# Patient Record
Sex: Female | Born: 1980 | Hispanic: No | Marital: Single | Smoking: Never smoker
Health system: Southern US, Community
[De-identification: ages and names within clinical notes are randomized; demographics above are authoritative.]

## PROBLEM LIST (undated history)

## (undated) ENCOUNTER — Inpatient Hospital Stay (HOSPITAL_COMMUNITY): Payer: Self-pay

## (undated) DIAGNOSIS — R011 Cardiac murmur, unspecified: Secondary | ICD-10-CM

## (undated) DIAGNOSIS — D649 Anemia, unspecified: Secondary | ICD-10-CM

## (undated) DIAGNOSIS — IMO0002 Reserved for concepts with insufficient information to code with codable children: Secondary | ICD-10-CM

## (undated) DIAGNOSIS — E669 Obesity, unspecified: Secondary | ICD-10-CM

## (undated) DIAGNOSIS — R87619 Unspecified abnormal cytological findings in specimens from cervix uteri: Secondary | ICD-10-CM

## (undated) DIAGNOSIS — Z8619 Personal history of other infectious and parasitic diseases: Secondary | ICD-10-CM

## (undated) HISTORY — DX: Anemia, unspecified: D64.9

## (undated) HISTORY — DX: Obesity, unspecified: E66.9

## (undated) HISTORY — DX: Personal history of other infectious and parasitic diseases: Z86.19

## (undated) HISTORY — DX: Unspecified abnormal cytological findings in specimens from cervix uteri: R87.619

## (undated) HISTORY — PX: TONSILLECTOMY: SUR1361

## (undated) HISTORY — DX: Cardiac murmur, unspecified: R01.1

## (undated) HISTORY — PX: WISDOM TOOTH EXTRACTION: SHX21

## (undated) HISTORY — DX: Reserved for concepts with insufficient information to code with codable children: IMO0002

---

## 1998-10-21 ENCOUNTER — Emergency Department (HOSPITAL_COMMUNITY): Admission: EM | Admit: 1998-10-21 | Discharge: 1998-10-21 | Payer: Self-pay | Admitting: *Deleted

## 1999-10-08 ENCOUNTER — Encounter: Admission: RE | Admit: 1999-10-08 | Discharge: 1999-10-08 | Payer: Self-pay | Admitting: Internal Medicine

## 1999-10-08 ENCOUNTER — Encounter: Payer: Self-pay | Admitting: Internal Medicine

## 2000-07-27 ENCOUNTER — Other Ambulatory Visit: Admission: RE | Admit: 2000-07-27 | Discharge: 2000-07-27 | Payer: Self-pay | Admitting: Internal Medicine

## 2001-03-11 ENCOUNTER — Ambulatory Visit (HOSPITAL_COMMUNITY): Admission: RE | Admit: 2001-03-11 | Discharge: 2001-03-11 | Payer: Self-pay | Admitting: *Deleted

## 2001-04-07 ENCOUNTER — Ambulatory Visit (HOSPITAL_COMMUNITY): Admission: RE | Admit: 2001-04-07 | Discharge: 2001-04-07 | Payer: Self-pay | Admitting: Obstetrics & Gynecology

## 2001-06-01 ENCOUNTER — Encounter (HOSPITAL_COMMUNITY): Admission: AD | Admit: 2001-06-01 | Discharge: 2001-06-04 | Payer: Self-pay | Admitting: *Deleted

## 2001-06-07 ENCOUNTER — Inpatient Hospital Stay (HOSPITAL_COMMUNITY): Admission: AD | Admit: 2001-06-07 | Discharge: 2001-06-11 | Payer: Self-pay | Admitting: Obstetrics and Gynecology

## 2001-06-07 ENCOUNTER — Encounter (INDEPENDENT_AMBULATORY_CARE_PROVIDER_SITE_OTHER): Payer: Self-pay

## 2002-02-23 ENCOUNTER — Ambulatory Visit (HOSPITAL_COMMUNITY): Admission: RE | Admit: 2002-02-23 | Discharge: 2002-02-23 | Payer: Self-pay | Admitting: *Deleted

## 2002-03-30 ENCOUNTER — Ambulatory Visit (HOSPITAL_COMMUNITY): Admission: RE | Admit: 2002-03-30 | Discharge: 2002-03-30 | Payer: Self-pay | Admitting: *Deleted

## 2002-05-04 ENCOUNTER — Ambulatory Visit (HOSPITAL_COMMUNITY): Admission: RE | Admit: 2002-05-04 | Discharge: 2002-05-04 | Payer: Self-pay | Admitting: *Deleted

## 2002-06-08 ENCOUNTER — Ambulatory Visit (HOSPITAL_COMMUNITY): Admission: RE | Admit: 2002-06-08 | Discharge: 2002-06-08 | Payer: Self-pay | Admitting: *Deleted

## 2002-06-29 ENCOUNTER — Ambulatory Visit (HOSPITAL_COMMUNITY): Admission: RE | Admit: 2002-06-29 | Discharge: 2002-06-29 | Payer: Self-pay | Admitting: *Deleted

## 2002-07-16 ENCOUNTER — Encounter (INDEPENDENT_AMBULATORY_CARE_PROVIDER_SITE_OTHER): Payer: Self-pay | Admitting: Specialist

## 2002-07-18 ENCOUNTER — Inpatient Hospital Stay (HOSPITAL_COMMUNITY): Admission: AD | Admit: 2002-07-18 | Discharge: 2002-07-20 | Payer: Self-pay | Admitting: *Deleted

## 2005-02-19 ENCOUNTER — Ambulatory Visit (HOSPITAL_COMMUNITY): Admission: RE | Admit: 2005-02-19 | Discharge: 2005-02-19 | Payer: Self-pay | Admitting: *Deleted

## 2005-02-25 ENCOUNTER — Ambulatory Visit (HOSPITAL_COMMUNITY): Admission: RE | Admit: 2005-02-25 | Discharge: 2005-02-25 | Payer: Self-pay | Admitting: *Deleted

## 2005-03-17 ENCOUNTER — Inpatient Hospital Stay (HOSPITAL_COMMUNITY): Admission: AD | Admit: 2005-03-17 | Discharge: 2005-03-18 | Payer: Self-pay | Admitting: Family Medicine

## 2005-03-20 ENCOUNTER — Ambulatory Visit (HOSPITAL_COMMUNITY): Admission: RE | Admit: 2005-03-20 | Discharge: 2005-03-20 | Payer: Self-pay | Admitting: Family Medicine

## 2005-04-21 ENCOUNTER — Ambulatory Visit (HOSPITAL_COMMUNITY): Admission: RE | Admit: 2005-04-21 | Discharge: 2005-04-21 | Payer: Self-pay | Admitting: *Deleted

## 2005-08-22 ENCOUNTER — Inpatient Hospital Stay (HOSPITAL_COMMUNITY): Admission: RE | Admit: 2005-08-22 | Discharge: 2005-08-25 | Payer: Self-pay | Admitting: Family Medicine

## 2005-08-22 ENCOUNTER — Ambulatory Visit: Payer: Self-pay | Admitting: *Deleted

## 2006-10-16 ENCOUNTER — Emergency Department (HOSPITAL_COMMUNITY): Admission: EM | Admit: 2006-10-16 | Discharge: 2006-10-16 | Payer: Self-pay | Admitting: Emergency Medicine

## 2007-08-15 ENCOUNTER — Emergency Department (HOSPITAL_COMMUNITY): Admission: EM | Admit: 2007-08-15 | Discharge: 2007-08-15 | Payer: Self-pay | Admitting: Emergency Medicine

## 2007-08-18 ENCOUNTER — Encounter: Payer: Self-pay | Admitting: Physician Assistant

## 2007-08-18 ENCOUNTER — Ambulatory Visit: Payer: Self-pay | Admitting: Obstetrics & Gynecology

## 2007-08-18 ENCOUNTER — Ambulatory Visit (HOSPITAL_COMMUNITY): Admission: RE | Admit: 2007-08-18 | Discharge: 2007-08-18 | Payer: Self-pay | Admitting: Family Medicine

## 2007-08-25 ENCOUNTER — Ambulatory Visit: Payer: Self-pay | Admitting: Obstetrics & Gynecology

## 2007-09-01 ENCOUNTER — Ambulatory Visit: Payer: Self-pay | Admitting: Obstetrics & Gynecology

## 2007-09-02 ENCOUNTER — Ambulatory Visit: Payer: Self-pay | Admitting: Obstetrics & Gynecology

## 2007-09-02 ENCOUNTER — Ambulatory Visit (HOSPITAL_COMMUNITY): Admission: RE | Admit: 2007-09-02 | Discharge: 2007-09-02 | Payer: Self-pay | Admitting: Family Medicine

## 2007-09-08 ENCOUNTER — Ambulatory Visit: Payer: Self-pay | Admitting: Obstetrics & Gynecology

## 2007-09-10 ENCOUNTER — Inpatient Hospital Stay (HOSPITAL_COMMUNITY): Admission: AD | Admit: 2007-09-10 | Discharge: 2007-09-12 | Payer: Self-pay | Admitting: Obstetrics & Gynecology

## 2007-09-10 ENCOUNTER — Ambulatory Visit: Payer: Self-pay | Admitting: Obstetrics & Gynecology

## 2010-03-31 ENCOUNTER — Encounter: Payer: Self-pay | Admitting: *Deleted

## 2010-07-26 NOTE — Discharge Summary (Signed)
[facility]  Patient:    Paige Novak, Paige Novak Visit Number: [id number] MRN: [id number]          Service Type: OBS Location: 910A [id number] [id number] Attending Physician:  Amada Kingfisher. Dictated by:   Arlis Porta, M.D. Admit Date:  06/07/2001 Discharge Date: 06/11/2001                             Discharge Summary  DISCHARGE DIAGNOSES:          16. A 30 year old, gravida 1, now para 1, status                                  post spontaneous vaginal delivery of a female                                  at 41-4/7 weeks.                               2. Postpartum hemorrhage.                               3. Endomyometritis.  PROCEDURES:                   A BPP was performed on admission which showed a score of 6/8 and low-normal amniotic fluid volume.  The patient had transfusion of two units of packed red blood cells.  LABORATORY DATA:              The CBC at discharge showed a white blood cell count of 11.7, hemoglobin 7.4, hematocrit 22.3, and platelet count 156.  The RPR was nonreactive.  The urinalysis was positive for 50 ketones, specific gravity 1.010, negative nitrites, and negative leukocyte esterase.  HOSPITAL COURSE: #1 - LABOR AND DELIVERY:  The patient is a 30 year old, G1, P0, who presented at 41-3/7 weeks with complaints of fetal tachycardia at her primary care physicians office.  She was found to have a decreasing AFI on BPP.  She was admitted for induction of labor, especially given her post dates.  She was started on Cytotec and was also started on Pitocin for augmentation.  She had artificial rupture of membranes.  An IUPC was placed.  She progressed to normal spontaneous vaginal delivery of a viable female on June 08, 2001, at 2111 hours.  There was a nuchal cord x 1 which was easily reduced.  Apgar scores were 8 and 9 at one and five minutes, respectively.  The placenta was intact and delivered spontaneously with a three-vessel cord noted.   There was a third degree laceration which was repaired with 2-0 and 3-0 Vicryl.  The estimated blood loss was 500 cc.  Both mother and baby tolerated the procedure well.  The patient did, however, have some postpartum hemorrhage with complaints of dizziness and associated tachycardia.  She was given IV fluids. She was started on methergine.  She was also started on Pitocin.  Her hemoglobin hit a low of 7.1 with a low hematocrit of 22.1.  She was transfused two units of packed red blood cells.  Her CBC stabilized to the levels stated above at discharge.  The patient was  asymptomatic at discharge with stabilization of her vital signs.  She will be discharged to home on iron supplementation.  #2 - ENDOMYOMETRITIS:  The patient developed a fever within 24 hours postpartum to a maximum of 102.5 degrees.  She was started on Unasyn IV.  She remained afebrile for 48 hours following that temperature.  She will be discharged to home on clindamycin p.o. x 7 days.  DISCHARGE MEDICATIONS:        1. Clindamycin 300 mg p.o. q.i.d. x 7 days.                               2. Tylenol p.r.n. for pain.                               3. Ferrous sulfate 325 mg p.o. b.i.d.  WOUND CARE:                   Per instruction booklet.  DIET:                         Regular.  ACTIVITY:                     Sexual activity per booklet.  FOLLOW-UP:                    She is to see [facility] in six weeks.  SPECIAL INSTRUCTIONS:         She is to return to the hospital for further bleeding, lightheadedness, fevers, and increased pain. Dictated by:   Arlis Porta, M.D. Attending Physician:  Amada Kingfisher. DD:  06/11/01 TD:  06/12/01 Job: [id number]

## 2010-07-26 NOTE — Discharge Summary (Signed)
Paige Novak, SCHRYVER                      ACCOUNT NO.:  [id number]   MEDICAL RECORD NO.:  [id number]                   PATIENT TYPE:  INP   LOCATION:  9139                                 FACILITY:  [facility]   PHYSICIAN:  Douglass Rivers, M.D.                DATE OF BIRTH:  [birth year]   DATE OF ADMISSION:  07/18/2002  DATE OF DISCHARGE:  07/20/2002                                 DISCHARGE SUMMARY   DISCHARGE DIAGNOSES:  1. Gravida 2, para 2, status post precipitous delivery of 39-4/7 weeks     viable female.  2. Status post second-degree laceration, repaired.  3. 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  4. Group B Streptococcus positive, untreated due to precipitous delivery.   DISCHARGE MEDICATIONS:  1. Motrin 600 mg one p.o. q.6h. p.r.n. pain.  2. Depo-Provera one shot every three months for contraception (first given     at [facility]).  3. Continue prenatal vitamins while breast feeding.   FOLLOW UP:  Return to [facility] in six weeks for a routine postpartum  followup.   DISCHARGE INSTRUCTIONS:  Otherwise, postpartum care as per routine.   BRIEF HOSPITAL COURSE:  The patient is a 30 year old G2, P1-0-0-1 who  presented at 39-4 weeks on 07/18/2002 after having contractions overnight.  Upon arrival to the maternity admissions, she was found to be 7 to 8 cm  dilated and then subsequently delivered precipitously a term female infant.  There were otherwise no complications except for a small, second-degree  laceration that was repaired.   Patient was GBS positive with a PENICILLIN allergy.  This GBS was noticed to  be sensitive only to Augmentin (clindamycin, erythromycin, and penicillin  resistant); the recommended was vancomycin, which was unable to be done due  to the precipitous delivery.   OTHER PAST MEDICAL HISTORY:  1. Significant for a history of anemia and blood transfusion after her first     delivery.  2. Has a history of external HPV.   PRENATAL HISTORY:  For  this pregnancy is significant for the infant had  abdominal findings on ultrasound, which were concerning for a bowel  obstruction.   MEDICATIONS:  Prenatal vitamins and iron.   ALLERGIES:  PENICILLIN, which gives hives and rash.   LABORATORY DATA:  Prenatal labs, blood type A positive, antibody screen  negative.  Hemoglobin (at discharge) is 9.3, platelets 250.  Rubella immune.  Hepatitis B positive.  Glucose was nonreactive.  _______ was declined.  GC  and Chlamydia are negative.  GBS was positive.   PHYSICAL EXAMINATION:  PELVIC:  Within normal limits.  No HPV lesions were  noted on the external genitalia.   PLAN:  At discharge, patient was doing well.  Understands the infant was  doing well in the newborn nursery.  Mother was breast and bottle feeding  without any issues.  ________ was declined for the female infant.  Patient  requested Depo-Provera shot to  be given prior to discharge.   Apgars at delivery were 8 and 9.  Infant weighed 8 pounds 4 ounces.   DISPOSITION:  Patient was discharged to home with the previously described  prescriptions and followup.                                               Douglass Rivers, M.D.    CH/MEDQ  D:  07/20/2002  T:  07/20/2002  Job:  [id number]

## 2010-12-05 LAB — POCT URINALYSIS DIP (DEVICE)
Glucose, UA: NEGATIVE
Glucose, UA: NEGATIVE
Glucose, UA: NEGATIVE
Glucose, UA: NEGATIVE
Hgb urine dipstick: NEGATIVE
Ketones, ur: NEGATIVE
Operator id: 120861
Operator id: 148111
Operator id: 120861
Specific Gravity, Urine: 1.01
Specific Gravity, Urine: 1.015
Specific Gravity, Urine: 1.02
Specific Gravity, Urine: 1.02
Urobilinogen, UA: 0.2
Urobilinogen, UA: 1
Urobilinogen, UA: 1
Urobilinogen, UA: 1
pH: 6.5

## 2010-12-05 LAB — CBC
HCT: 28.7 — ABNORMAL LOW
Hemoglobin: 9.2 — ABNORMAL LOW
MCHC: 32.1
MCV: 80.1
MCV: 80.9
RBC: 3.9
RBC: 3.51 — ABNORMAL LOW
RBC: 3.81 — ABNORMAL LOW
RDW: 13.8
RDW: 14
WBC: 12.7 — ABNORMAL HIGH
WBC: 9.7

## 2010-12-05 LAB — POCT I-STAT, CHEM 8
Calcium, Ion: 1.09 — ABNORMAL LOW
Glucose, Bld: 68 — ABNORMAL LOW
HCT: 32 — ABNORMAL LOW
Hemoglobin: 10.9 — ABNORMAL LOW
Potassium: 3.4 — ABNORMAL LOW

## 2010-12-05 LAB — URINALYSIS, ROUTINE W REFLEX MICROSCOPIC
Ketones, ur: >80 — AB
Nitrite: NEGATIVE
Protein, ur: 30 — AB
Urobilinogen, UA: 1

## 2010-12-05 LAB — DIFFERENTIAL
Basophils Relative: 0
Eosinophils Absolute: 0
Monocytes Absolute: 0.4
Monocytes Relative: 6
Neutrophils Relative %: 78 — ABNORMAL HIGH

## 2010-12-05 LAB — RPR: RPR Ser Ql: NONREACTIVE

## 2010-12-23 LAB — WET PREP, GENITAL
Trich, Wet Prep: NONE SEEN
Yeast Wet Prep HPF POC: NONE SEEN

## 2010-12-23 LAB — GC/CHLAMYDIA PROBE AMP, GENITAL: GC Probe Amp, Genital: NEGATIVE

## 2010-12-23 LAB — URINALYSIS, ROUTINE W REFLEX MICROSCOPIC
Bilirubin Urine: NEGATIVE
Glucose, UA: NEGATIVE
Ketones, ur: 15 — AB
pH: 6

## 2011-03-11 NOTE — L&D Delivery Note (Signed)
Delivery Note  Patient was in the OR in preparation for cesarean section.  After receiving spinal anesthesia, she was noted to be fully dilated with fetal feet out of the vagina and most of fetal body in vagina. The decision was discussed with the patient, and she consented to trial of vaginal breech extraction.  Dr. Christin Bach was called to assist with the breech delivery. Given recent spinal anesthesia, the patient was not able to push adequately, and required some help via fundal pressure from Dr. Emelda Fear.  The difficult part of the delivery was delivery of the arms which were noted to be extended above fetal head. With some difficulty, the right arm was flexed and delivered, and then it was less difficult to deliver the left arm.  The head delivered without much difficulty.    At 9:12 AM a viable female was delivered.  APGAR: 2, 9; arterial cord pH 7.21.  He was noted to have difficulty in moving right arm, left arm appeared to have normal mobility. There were also superficial scratches on baby's torso and neck sustained during delivery.  After the first minute, he started crying and moving vigorously, no other anomalies noted. Please refer to neonatology staff notes for further details.    Placenta status: Intact, Spontaneous.  Cord: 3 vessels Anesthesia: Spinal  Episiotomy: None Lacerations: 1st degree;Perineal, not repaired Est. Blood Loss (mL): 400 ml  Mom to L&D.  Baby to nursery-stable.  Jaynie Collins, MD, FACOG Attending Obstetrician & Gynecologist Faculty Practice, [facility] [place]

## 2011-10-04 ENCOUNTER — Encounter (HOSPITAL_COMMUNITY): Payer: Self-pay | Admitting: *Deleted

## 2011-10-04 ENCOUNTER — Inpatient Hospital Stay (HOSPITAL_COMMUNITY)
Admission: AD | Admit: 2011-10-04 | Discharge: 2011-10-04 | Disposition: A | Discharge status: Home / Self Care | Payer: Medicaid Other | Source: Ambulatory Visit | Attending: Obstetrics and Gynecology | Admitting: Obstetrics and Gynecology

## 2011-10-04 DIAGNOSIS — R42 Dizziness and giddiness: Secondary | ICD-10-CM | POA: Insufficient documentation

## 2011-10-04 DIAGNOSIS — O99891 Other specified diseases and conditions complicating pregnancy: Secondary | ICD-10-CM | POA: Insufficient documentation

## 2011-10-04 LAB — URINALYSIS, ROUTINE W REFLEX MICROSCOPIC
Bilirubin Urine: NEGATIVE
Hgb urine dipstick: NEGATIVE
Specific Gravity, Urine: 1.025 (ref 1.005–1.030)
pH: 6 (ref 5.0–8.0)

## 2011-10-04 LAB — BASIC METABOLIC PANEL
BUN: 10 mg/dL (ref 6–23)
CO2: 24 mEq/L (ref 19–32)
Chloride: 100 mEq/L (ref 96–112)
Creatinine, Ser: 0.62 mg/dL (ref 0.50–1.10)

## 2011-10-04 LAB — CBC
HCT: 29.9 % — ABNORMAL LOW (ref 36.0–46.0)
Hemoglobin: 9.2 g/dL — ABNORMAL LOW (ref 12.0–15.0)
MCV: 79.5 fL (ref 78.0–100.0)
RBC: 3.76 MIL/uL — ABNORMAL LOW (ref 3.87–5.11)
WBC: 7.9 10*3/uL (ref 4.0–10.5)

## 2011-10-04 MED ORDER — FERROUS SULFATE 325 (65 FE) MG PO TABS: 325.0000 mg | ORAL_TABLET | Freq: Every day | ORAL | Status: DC

## 2011-10-04 NOTE — MAU Provider Note (Signed)
History     CSN: [id number]  Arrival date and time: 10/04/11 1435   First Provider Initiated Contact with Patient 10/04/11 1554      Chief Complaint  Patient presents with  . Dizziness   HPI Patient is a 31 yo G5P4004 at 65.5 EGA who presents with complaint of light headedness for the past couple of days.  States she gets light headed when she gets out of bed in the morning and when she gets up after lying down for a while.  It gets better when she immediately lies down.  She does not get light headed when she has been standing up for a while.  She denies any episodes of syncope.  States she has been able to eat and drink normally. She only eats 2 meals a day. She denies nausea and vomiting, chest pain, shortness of breath, changes in vision, headaches.  She has not had any blood pressure issues during this pregnancy.  OB History    Grav Para Term Preterm Abortions TAB SAB Ect Mult Living   5 4 4  0 0 0 0 0 0 4      Past Medical History  Diagnosis Date  . No pertinent past medical history     Past Surgical History  Procedure Date  . Wisdom tooth extraction     History reviewed. No pertinent family history.  History  Substance Use Topics  . Smoking status: Never Smoker   . Smokeless tobacco: Not on file  . Alcohol Use: No    Allergies:  Allergies  Allergen Reactions  . Penicillins Hives    Prescriptions prior to admission  Medication Sig Dispense Refill  . Prenatal Vit-Fe Fumarate-FA (PRENATAL MULTIVITAMIN) TABS Take 1 tablet by mouth daily.        ROS negative except per HPI Physical Exam   Blood pressure 116/66, pulse 89, temperature 98.5 F (36.9 C), temperature source Oral, resp. rate 18, height 5\' 4"  (1.626 m), weight 102.422 kg (225 lb 12.8 oz), SpO2 99.00%.  Filed Vitals:   10/04/11 1445 10/04/11 1523 lying down 10/04/11 1525 sitting 10/04/11 1527 standing  BP: 120/58 110/56 113/64 116/66  Pulse: 84 77 97 89  Temp: 98.5 F (36.9 C)     TempSrc:  Oral     Resp:  18 20 18   Height: 5\' 4"  (1.626 m)     Weight: 102.422 kg (225 lb 12.8 oz)     SpO2: 99%       Physical Exam  Constitutional: She is oriented to person, place, and time. She appears well-developed and well-nourished.  HENT:  Head: Normocephalic and atraumatic.  Mouth/Throat: Oropharynx is clear and moist.       Normal bilateral tympanic membranes  Cardiovascular: Normal rate, regular rhythm and normal heart sounds.   Respiratory: Effort normal and breath sounds normal.  GI: Soft. There is no tenderness.  Neurological: She is alert and oriented to person, place, and time.  Psychiatric: She has a normal mood and affect.   CBG 70 MAU Course  Procedures  Assessment and Plan  Patient is a G5P4004 at 30.5 EGA who presents with complaint of light headedness.  Light headedness: orthostatics borderline for orthostatic hypotension.  CBG within normal limits. CBC normal.  Patient advised to eat every 3 hours and to stay hydrated. Patient discharged home from the MAU.  Patient also advised to schedule a prenatal appointment with the health department as soon as possible.  Marikay Alar 10/04/2011, 4:03 PM   I  have seen/examined this patient and agree with the above. Romuald Mccaslin E.

## 2011-10-04 NOTE — MAU Note (Signed)
Patient c/o feeling light headed which started yesterday patient is 30 weeks, patient goes to [facility] for prenatal care.

## 2011-10-05 NOTE — MAU Provider Note (Signed)
Agree with above note.  Paige Novak 10/05/2011 7:43 AM

## 2011-11-12 ENCOUNTER — Other Ambulatory Visit (HOSPITAL_COMMUNITY): Payer: Self-pay | Admitting: Family

## 2011-11-12 DIAGNOSIS — Z0489 Encounter for examination and observation for other specified reasons: Secondary | ICD-10-CM

## 2011-11-13 ENCOUNTER — Ambulatory Visit (HOSPITAL_COMMUNITY)
Admission: RE | Admit: 2011-11-13 | Discharge: 2011-11-13 | Disposition: A | Discharge status: Home / Self Care | Payer: Medicaid Other | Source: Ambulatory Visit | Attending: Family | Admitting: Family

## 2011-11-13 ENCOUNTER — Other Ambulatory Visit (HOSPITAL_COMMUNITY): Payer: Self-pay | Admitting: Family

## 2011-11-13 DIAGNOSIS — Z3689 Encounter for other specified antenatal screening: Secondary | ICD-10-CM | POA: Insufficient documentation

## 2011-11-13 DIAGNOSIS — Z0489 Encounter for examination and observation for other specified reasons: Secondary | ICD-10-CM

## 2011-11-13 DIAGNOSIS — O0930 Supervision of pregnancy with insufficient antenatal care, unspecified trimester: Secondary | ICD-10-CM | POA: Insufficient documentation

## 2011-11-20 LAB — OB RESULTS CONSOLE GBS: GBS: POSITIVE

## 2011-11-27 ENCOUNTER — Encounter (HOSPITAL_COMMUNITY): Payer: Self-pay | Admitting: *Deleted

## 2011-11-27 ENCOUNTER — Telehealth (HOSPITAL_COMMUNITY): Payer: Self-pay | Admitting: *Deleted

## 2011-11-27 ENCOUNTER — Inpatient Hospital Stay (HOSPITAL_COMMUNITY)
Admission: RE | Admit: 2011-11-27 | Payer: Medicaid Other | Source: Ambulatory Visit | Admitting: Obstetrics and Gynecology

## 2011-11-27 NOTE — Telephone Encounter (Signed)
Preadmission screen  

## 2011-11-28 ENCOUNTER — Encounter (HOSPITAL_COMMUNITY): Payer: Self-pay | Admitting: *Deleted

## 2011-11-28 ENCOUNTER — Inpatient Hospital Stay (HOSPITAL_COMMUNITY)
Admission: AD | Admit: 2011-11-28 | Discharge: 2011-12-01 | DRG: 775 | Disposition: A | Discharge status: Home / Self Care | Payer: Medicaid Other | Source: Ambulatory Visit | Attending: Obstetrics and Gynecology | Admitting: Obstetrics and Gynecology

## 2011-11-28 ENCOUNTER — Inpatient Hospital Stay (HOSPITAL_COMMUNITY): Payer: Medicaid Other

## 2011-11-28 DIAGNOSIS — O4290 Premature rupture of membranes, unspecified as to length of time between rupture and onset of labor, unspecified weeks of gestation: Principal | ICD-10-CM | POA: Diagnosis present

## 2011-11-28 DIAGNOSIS — O321XX0 Maternal care for breech presentation, not applicable or unspecified: Secondary | ICD-10-CM | POA: Diagnosis present

## 2011-11-28 LAB — URINALYSIS, ROUTINE W REFLEX MICROSCOPIC
Glucose, UA: NEGATIVE mg/dL
Hgb urine dipstick: NEGATIVE
Specific Gravity, Urine: 1.02 (ref 1.005–1.030)
Urobilinogen, UA: 1 mg/dL (ref 0.0–1.0)

## 2011-11-28 LAB — RAPID URINE DRUG SCREEN, HOSP PERFORMED
Cocaine: NOT DETECTED
Opiates: NOT DETECTED
Tetrahydrocannabinol: NOT DETECTED
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
## 2011-11-28 NOTE — MAU Note (Signed)
Frequent urination around 9:30 with leaking constantly.  Does not feel her water has broken. Has occasional irregular contractions.
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
## 2011-11-29 ENCOUNTER — Encounter (HOSPITAL_COMMUNITY): Payer: Self-pay | Admitting: *Deleted

## 2011-11-29 ENCOUNTER — Inpatient Hospital Stay (HOSPITAL_COMMUNITY): Payer: Medicaid Other | Admitting: Anesthesiology

## 2011-11-29 ENCOUNTER — Encounter (HOSPITAL_COMMUNITY): Payer: Self-pay | Admitting: Anesthesiology

## 2011-11-29 ENCOUNTER — Encounter (HOSPITAL_COMMUNITY): Payer: Self-pay

## 2011-11-29 ENCOUNTER — Encounter (HOSPITAL_COMMUNITY)
Admission: AD | Disposition: A | Discharge status: Home / Self Care | Payer: Self-pay | Source: Ambulatory Visit | Attending: Obstetrics and Gynecology

## 2011-11-29 ENCOUNTER — Encounter (HOSPITAL_COMMUNITY): Payer: Self-pay | Admitting: Registered Nurse

## 2011-11-29 DIAGNOSIS — O4290 Premature rupture of membranes, unspecified as to length of time between rupture and onset of labor, unspecified weeks of gestation: Secondary | ICD-10-CM

## 2011-11-29 DIAGNOSIS — O321XX0 Maternal care for breech presentation, not applicable or unspecified: Secondary | ICD-10-CM

## 2011-11-29 LAB — CBC
HCT: 29.6 % — ABNORMAL LOW (ref 36.0–46.0)
Hemoglobin: 9.2 g/dL — ABNORMAL LOW (ref 12.0–15.0)
MCH: 24.8 pg — ABNORMAL LOW (ref 26.0–34.0)
MCHC: 31.1 g/dL (ref 30.0–36.0)
MCV: 79.8 fL (ref 78.0–100.0)
RBC: 3.71 MIL/uL — ABNORMAL LOW (ref 3.87–5.11)

## 2011-11-29 LAB — COMPREHENSIVE METABOLIC PANEL
AST: 26 U/L (ref 0–37)
CO2: 22 mEq/L (ref 19–32)
Calcium: 9.3 mg/dL (ref 8.4–10.5)
Creatinine, Ser: 0.62 mg/dL (ref 0.50–1.10)
GFR calc non Af Amer: >90 mL/min (ref >90)
Total Protein: 6.9 g/dL (ref 6.0–8.3)

## 2011-11-29 LAB — RAPID HIV SCREEN (WH-MAU): Rapid HIV Screen: NONREACTIVE

## 2011-11-29 SURGERY — Surgical Case
Anesthesia: Spinal | Wound class: Clean Contaminated

## 2011-11-29 MED ORDER — VANCOMYCIN HCL IN DEXTROSE 1-5 GM/200ML-% IV SOLN
1000.0000 mg | Freq: Two times a day (BID) | INTRAVENOUS | Status: DC
  Administered 2011-11-29: 1000 mg via INTRAVENOUS
  Filled 2011-11-29 (×3): qty 200

## 2011-11-29 MED ORDER — OXYCODONE-ACETAMINOPHEN 5-325 MG PO TABS: 1.0000 | ORAL_TABLET | ORAL | Status: DC | PRN

## 2011-11-29 MED ORDER — KETOROLAC TROMETHAMINE 30 MG/ML IJ SOLN: 30.0000 mg | Freq: Four times a day (QID) | INTRAMUSCULAR | Status: AC | PRN

## 2011-11-29 MED ORDER — OXYTOCIN 40 UNITS IN LACTATED RINGERS INFUSION - SIMPLE MED
1.0000 m[IU]/min | INTRAVENOUS | Status: DC
  Administered 2011-11-29: 2 m[IU]/min via INTRAVENOUS
  Filled 2011-11-29: qty 1000

## 2011-11-29 MED ORDER — OXYTOCIN 40 UNITS IN LACTATED RINGERS INFUSION - SIMPLE MED: 62.5000 mL/h | Freq: Once | INTRAVENOUS | Status: DC

## 2011-11-29 MED ORDER — PHENYLEPHRINE HCL 10 MG/ML IJ SOLN
INTRAMUSCULAR | Status: DC | PRN
  Administered 2011-11-29: 40 ug via INTRAVENOUS

## 2011-11-29 MED ORDER — ONDANSETRON HCL 4 MG PO TABS: 4.0000 mg | ORAL_TABLET | ORAL | Status: DC | PRN

## 2011-11-29 MED ORDER — SODIUM CHLORIDE 0.9 % IV SOLN: 1.0000 ug/kg/h | INTRAVENOUS | Status: DC | PRN

## 2011-11-29 MED ORDER — SIMETHICONE 80 MG PO CHEW: 80.0000 mg | CHEWABLE_TABLET | ORAL | Status: DC | PRN

## 2011-11-29 MED ORDER — ONDANSETRON HCL 4 MG/2ML IJ SOLN: 4.0000 mg | INTRAMUSCULAR | Status: DC | PRN

## 2011-11-29 MED ORDER — SENNOSIDES-DOCUSATE SODIUM 8.6-50 MG PO TABS: 2.0000 | ORAL_TABLET | Freq: Every day | ORAL | Status: DC

## 2011-11-29 MED ORDER — INFLUENZA VIRUS VACC SPLIT PF IM SUSP: 0.5000 mL | INTRAMUSCULAR | Status: DC

## 2011-11-29 MED ORDER — WITCH HAZEL-GLYCERIN EX PADS: 1.0000 "application " | MEDICATED_PAD | CUTANEOUS | Status: DC | PRN

## 2011-11-29 MED ORDER — VANCOMYCIN HCL IN DEXTROSE 1-5 GM/200ML-% IV SOLN: 1000.0000 mg | Freq: Two times a day (BID) | INTRAVENOUS | Status: DC

## 2011-11-29 MED ORDER — CLINDAMYCIN PHOSPHATE 900 MG/50ML IV SOLN
900.0000 mg | Freq: Three times a day (TID) | INTRAVENOUS | Status: DC
  Administered 2011-11-29: 900 mg via INTRAVENOUS
  Filled 2011-11-29 (×3): qty 50

## 2011-11-29 MED ORDER — METOCLOPRAMIDE HCL 5 MG/ML IJ SOLN: 10.0000 mg | Freq: Three times a day (TID) | INTRAMUSCULAR | Status: DC | PRN

## 2011-11-29 MED ORDER — IBUPROFEN 600 MG PO TABS
600.0000 mg | ORAL_TABLET | Freq: Four times a day (QID) | ORAL | Status: DC
  Administered 2011-11-30 – 2011-12-01 (×6): 600 mg via ORAL
  Filled 2011-11-29 (×7): qty 1

## 2011-11-29 MED ORDER — NALBUPHINE HCL 10 MG/ML IJ SOLN
5.0000 mg | INTRAMUSCULAR | Status: DC | PRN
Start: 2011-11-29 — End: 2011-12-01
  Filled 2011-11-29: qty 1

## 2011-11-29 MED ORDER — OXYTOCIN 40 UNITS IN LACTATED RINGERS INFUSION - SIMPLE MED
INTRAVENOUS | Status: AC
  Filled 2011-11-29: qty 1000

## 2011-11-29 MED ORDER — MORPHINE SULFATE 0.5 MG/ML IJ SOLN
INTRAMUSCULAR | Status: AC
  Filled 2011-11-29: qty 10

## 2011-11-29 MED ORDER — PRENATAL MULTIVITAMIN CH: 1.0000 | ORAL_TABLET | Freq: Every day | ORAL | Status: DC

## 2011-11-29 MED ORDER — FENTANYL CITRATE 0.05 MG/ML IJ SOLN
INTRAMUSCULAR | Status: AC
  Filled 2011-11-29: qty 5

## 2011-11-29 MED ORDER — BENZOCAINE-MENTHOL 20-0.5 % EX AERO: 1.0000 "application " | INHALATION_SPRAY | CUTANEOUS | Status: DC | PRN

## 2011-11-29 MED ORDER — MEPERIDINE HCL 25 MG/ML IJ SOLN: 6.2500 mg | INTRAMUSCULAR | Status: DC | PRN

## 2011-11-29 MED ORDER — CITRIC ACID-SODIUM CITRATE 334-500 MG/5ML PO SOLN
30.0000 mL | ORAL | Status: DC | PRN
  Administered 2011-11-29: 30 mL via ORAL
  Filled 2011-11-29: qty 15

## 2011-11-29 MED ORDER — SENNOSIDES-DOCUSATE SODIUM 8.6-50 MG PO TABS
2.0000 | ORAL_TABLET | Freq: Every day | ORAL | Status: DC
  Administered 2011-11-29 – 2011-11-30 (×2): 2 via ORAL

## 2011-11-29 MED ORDER — LACTATED RINGERS IV SOLN
500.0000 mL | INTRAVENOUS | Status: DC | PRN
  Administered 2011-11-29: 1000 mL via INTRAVENOUS

## 2011-11-29 MED ORDER — DIBUCAINE 1 % RE OINT: 1.0000 "application " | TOPICAL_OINTMENT | RECTAL | Status: DC | PRN

## 2011-11-29 MED ORDER — BUPIVACAINE IN DEXTROSE 0.75-8.25 % IT SOLN
INTRATHECAL | Status: DC | PRN
  Administered 2011-11-29: 1.4 mL via INTRATHECAL

## 2011-11-29 MED ORDER — OXYTOCIN 10 UNIT/ML IJ SOLN
INTRAMUSCULAR | Status: AC
  Filled 2011-11-29: qty 1

## 2011-11-29 MED ORDER — ZOLPIDEM TARTRATE 5 MG PO TABS: 5.0000 mg | ORAL_TABLET | Freq: Every evening | ORAL | Status: DC | PRN

## 2011-11-29 MED ORDER — TERBUTALINE SULFATE 1 MG/ML IJ SOLN
0.2500 mg | Freq: Once | INTRAMUSCULAR | Status: AC | PRN
  Administered 2011-11-29: 0.25 mg via SUBCUTANEOUS
  Filled 2011-11-29: qty 1

## 2011-11-29 MED ORDER — NALBUPHINE HCL 10 MG/ML IJ SOLN
5.0000 mg | INTRAMUSCULAR | Status: DC | PRN
  Filled 2011-11-29: qty 1

## 2011-11-29 MED ORDER — HYDROMORPHONE HCL PF 1 MG/ML IJ SOLN: 0.2500 mg | INTRAMUSCULAR | Status: DC | PRN

## 2011-11-29 MED ORDER — SCOPOLAMINE 1 MG/3DAYS TD PT72
1.0000 | MEDICATED_PATCH | Freq: Once | TRANSDERMAL | Status: DC
  Filled 2011-11-29: qty 1

## 2011-11-29 MED ORDER — FENTANYL CITRATE 0.05 MG/ML IJ SOLN
INTRAMUSCULAR | Status: DC | PRN
  Administered 2011-11-29: 12.5 ug via INTRATHECAL

## 2011-11-29 MED ORDER — KETOROLAC TROMETHAMINE 30 MG/ML IJ SOLN: 15.0000 mg | Freq: Once | INTRAMUSCULAR | Status: DC | PRN

## 2011-11-29 MED ORDER — PROMETHAZINE HCL 25 MG/ML IJ SOLN: 6.2500 mg | INTRAMUSCULAR | Status: DC | PRN

## 2011-11-29 MED ORDER — LACTATED RINGERS IV SOLN
INTRAVENOUS | Status: DC | PRN
  Administered 2011-11-29 (×3): via INTRAVENOUS

## 2011-11-29 MED ORDER — ONDANSETRON HCL 4 MG/2ML IJ SOLN: 4.0000 mg | Freq: Three times a day (TID) | INTRAMUSCULAR | Status: DC | PRN

## 2011-11-29 MED ORDER — PHENYLEPHRINE 40 MCG/ML (10ML) SYRINGE FOR IV PUSH (FOR BLOOD PRESSURE SUPPORT)
PREFILLED_SYRINGE | INTRAVENOUS | Status: AC
  Filled 2011-11-29: qty 5

## 2011-11-29 MED ORDER — LIDOCAINE HCL (PF) 1 % IJ SOLN: 30.0000 mL | INTRAMUSCULAR | Status: DC | PRN

## 2011-11-29 MED ORDER — VANCOMYCIN HCL IN DEXTROSE 1-5 GM/200ML-% IV SOLN
1000.0000 mg | Freq: Two times a day (BID) | INTRAVENOUS | Status: DC
  Filled 2011-11-29 (×2): qty 200

## 2011-11-29 MED ORDER — LACTATED RINGERS IV SOLN
INTRAVENOUS | Status: DC
  Administered 2011-11-29 (×2): via INTRAVENOUS

## 2011-11-29 MED ORDER — DIPHENHYDRAMINE HCL 50 MG/ML IJ SOLN: 25.0000 mg | INTRAMUSCULAR | Status: DC | PRN

## 2011-11-29 MED ORDER — OXYTOCIN 10 UNIT/ML IJ SOLN
40.0000 [IU] | INTRAVENOUS | Status: DC | PRN
  Administered 2011-11-29: 40 [IU] via INTRAVENOUS

## 2011-11-29 MED ORDER — LANOLIN HYDROUS EX OINT: TOPICAL_OINTMENT | CUTANEOUS | Status: DC | PRN

## 2011-11-29 MED ORDER — PRENATAL MULTIVITAMIN CH
1.0000 | ORAL_TABLET | Freq: Every day | ORAL | Status: DC
  Administered 2011-11-30 – 2011-12-01 (×2): 1 via ORAL
  Filled 2011-11-29 (×2): qty 1

## 2011-11-29 MED ORDER — IBUPROFEN 600 MG PO TABS: 600.0000 mg | ORAL_TABLET | Freq: Four times a day (QID) | ORAL | Status: DC

## 2011-11-29 MED ORDER — ACETAMINOPHEN 325 MG PO TABS: 650.0000 mg | ORAL_TABLET | ORAL | Status: DC | PRN

## 2011-11-29 MED ORDER — DIPHENHYDRAMINE HCL 25 MG PO CAPS: 25.0000 mg | ORAL_CAPSULE | Freq: Four times a day (QID) | ORAL | Status: DC | PRN

## 2011-11-29 MED ORDER — EPHEDRINE 5 MG/ML INJ
INTRAVENOUS | Status: AC
  Filled 2011-11-29: qty 10

## 2011-11-29 MED ORDER — SODIUM CHLORIDE 0.9 % IJ SOLN: 3.0000 mL | INTRAMUSCULAR | Status: DC | PRN

## 2011-11-29 MED ORDER — NALOXONE HCL 0.4 MG/ML IJ SOLN: 0.4000 mg | INTRAMUSCULAR | Status: DC | PRN

## 2011-11-29 MED ORDER — EPHEDRINE SULFATE 50 MG/ML IJ SOLN
INTRAMUSCULAR | Status: DC | PRN
  Administered 2011-11-29 (×3): 5 mg via INTRAVENOUS

## 2011-11-29 MED ORDER — BUPIVACAINE HCL (PF) 0.25 % IJ SOLN
INTRAMUSCULAR | Status: AC
  Filled 2011-11-29: qty 30

## 2011-11-29 MED ORDER — MORPHINE SULFATE (PF) 0.5 MG/ML IJ SOLN
INTRAMUSCULAR | Status: DC | PRN
  Administered 2011-11-29: .1 mg via INTRATHECAL

## 2011-11-29 MED ORDER — VANCOMYCIN HCL IN DEXTROSE 1-5 GM/200ML-% IV SOLN
1000.0000 mg | Freq: Two times a day (BID) | INTRAVENOUS | Status: AC
  Administered 2011-11-29 – 2011-11-30 (×2): 1000 mg via INTRAVENOUS
  Filled 2011-11-29 (×2): qty 200

## 2011-11-29 MED ORDER — TETANUS-DIPHTH-ACELL PERTUSSIS 5-2.5-18.5 LF-MCG/0.5 IM SUSP
0.5000 mL | Freq: Once | INTRAMUSCULAR | Status: AC
  Administered 2011-11-30: 0.5 mL via INTRAMUSCULAR
  Filled 2011-11-29: qty 0.5

## 2011-11-29 MED ORDER — TETANUS-DIPHTH-ACELL PERTUSSIS 5-2.5-18.5 LF-MCG/0.5 IM SUSP: 0.5000 mL | Freq: Once | INTRAMUSCULAR | Status: DC

## 2011-11-29 MED ORDER — OXYTOCIN BOLUS FROM INFUSION
500.0000 mL | Freq: Once | INTRAVENOUS | Status: DC
  Filled 2011-11-29: qty 500

## 2011-11-29 MED ORDER — KETOROLAC TROMETHAMINE 60 MG/2ML IM SOLN: 60.0000 mg | Freq: Once | INTRAMUSCULAR | Status: DC | PRN

## 2011-11-29 MED ORDER — ONDANSETRON HCL 4 MG/2ML IJ SOLN
INTRAMUSCULAR | Status: DC | PRN
  Administered 2011-11-29: 4 mg via INTRAVENOUS

## 2011-11-29 MED ORDER — ONDANSETRON HCL 4 MG/2ML IJ SOLN: 4.0000 mg | Freq: Four times a day (QID) | INTRAMUSCULAR | Status: DC | PRN

## 2011-11-29 MED ORDER — DIPHENHYDRAMINE HCL 50 MG/ML IJ SOLN: 12.5000 mg | INTRAMUSCULAR | Status: DC | PRN

## 2011-11-29 MED ORDER — IBUPROFEN 600 MG PO TABS: 600.0000 mg | ORAL_TABLET | Freq: Four times a day (QID) | ORAL | Status: DC | PRN

## 2011-11-29 MED ORDER — DIPHENHYDRAMINE HCL 25 MG PO CAPS: 25.0000 mg | ORAL_CAPSULE | ORAL | Status: DC | PRN

## 2011-11-29 SURGICAL SUPPLY — 40 items
APL SKNCLS STERI-STRIP NONHPOA (GAUZE/BANDAGES/DRESSINGS)
BENZOIN TINCTURE PRP APPL 2/3 (GAUZE/BANDAGES/DRESSINGS) IMPLANT
CLOTH BEACON ORANGE TIMEOUT ST (SAFETY) ×2 IMPLANT
DRAPE SURG 17X23 STRL (DRAPES) ×1 IMPLANT
DRSG COVADERM 4X10 (GAUZE/BANDAGES/DRESSINGS) IMPLANT
DURAPREP 26ML APPLICATOR (WOUND CARE) ×2 IMPLANT
ELECT REM PT RETURN 9FT ADLT (ELECTROSURGICAL) ×2
ELECTRODE REM PT RTRN 9FT ADLT (ELECTROSURGICAL) ×1 IMPLANT
EXTRACTOR VACUUM KIWI (MISCELLANEOUS) IMPLANT
GLOVE BIO SURGEON ST LM GN SZ9 (GLOVE) ×2 IMPLANT
GLOVE BIO SURGEON STRL SZ7 (GLOVE) ×1 IMPLANT
GLOVE BIOGEL PI IND STRL 7.0 (GLOVE) IMPLANT
GLOVE BIOGEL PI IND STRL 9 (GLOVE) ×2 IMPLANT
GLOVE BIOGEL PI INDICATOR 7.0 (GLOVE) ×2
GLOVE BIOGEL PI INDICATOR 9 (GLOVE) ×1
GOWN PREVENTION PLUS LG XLONG (DISPOSABLE) ×2 IMPLANT
GOWN PREVENTION PLUS XLARGE (GOWN DISPOSABLE) ×2 IMPLANT
GOWN STRL REIN 3XL LVL4 (GOWN DISPOSABLE) ×1 IMPLANT
NDL HYPO 25X5/8 SAFETYGLIDE (NEEDLE) IMPLANT
NEEDLE HYPO 25X5/8 SAFETYGLIDE (NEEDLE) ×2 IMPLANT
NS IRRIG 1000ML POUR BTL (IV SOLUTION) ×1 IMPLANT
PACK C SECTION WH (CUSTOM PROCEDURE TRAY) ×2 IMPLANT
PAD OB MATERNITY 4.3X12.25 (PERSONAL CARE ITEMS) IMPLANT
RETRACTOR WND ALEXIS 25 LRG (MISCELLANEOUS) IMPLANT
RTRCTR C-SECT PINK 25CM LRG (MISCELLANEOUS) IMPLANT
RTRCTR WOUND ALEXIS 25CM LRG (MISCELLANEOUS)
SLEEVE SCD COMPRESS KNEE MED (MISCELLANEOUS) ×1 IMPLANT
STRIP CLOSURE SKIN 1/2X4 (GAUZE/BANDAGES/DRESSINGS) IMPLANT
SUT CHROMIC 0 CTX 36 (SUTURE) ×2 IMPLANT
SUT MNCRL 0 VIOLET CTX 36 (SUTURE) IMPLANT
SUT MONOCRYL 0 CTX 36 (SUTURE) ×2
SUT VIC AB 0 CT1 27 (SUTURE)
SUT VIC AB 0 CT1 27XBRD ANBCTR (SUTURE) ×1 IMPLANT
SUT VIC AB 0 CT1 36 (SUTURE) ×2 IMPLANT
SUT VIC AB 2-0 CT1 27 (SUTURE)
SUT VIC AB 2-0 CT1 TAPERPNT 27 (SUTURE) ×2 IMPLANT
SUT VIC AB 4-0 KS 27 (SUTURE) ×2 IMPLANT
SYR BULB IRRIGATION 50ML (SYRINGE) IMPLANT
TRAY FOLEY CATH 14FR (SET/KITS/TRAYS/PACK) ×2 IMPLANT
WATER STERILE IRR 1000ML POUR (IV SOLUTION) ×2 IMPLANT

## 2011-11-29 NOTE — Progress Notes (Signed)
Paige Novak is a 31 y.o. G5P4004 at [redacted]w[redacted]d by ultrasound admitted for PROM, breech, complete  Subjective: Pt feels slight increased contraction intensity. NOT significantly uncomfortable with contractions. Continuous monitoring in place Cat I Objective: BP 123/75  Pulse 67  Temp 98.2 F (36.8 C) (Oral)  Resp 18  Ht 5\' 4"  (1.626 m)  Wt 107.559 kg (237 lb 2 oz)  BMI 40.70 kg/m2      FHT:  FHR: 145 bpm, variability: moderate,  accelerations:  Present,  decelerations:  Absent UC:   irregular, every 5 minutes SVE:   Dilation: 2 Effacement (%): 50 Station: -2 Exam by:: Dr. Emelda Fear  Labs: Lab Results  Component Value Date   WBC 7.4 11/29/2011   HGB 9.2* 11/29/2011   HCT 29.6* 11/29/2011   MCV 79.8 11/29/2011   PLT 195 11/29/2011    Assessment / Plan: Protracted latent phase , complete breech, refusing cesarean at present. Plan : Pitocin augmentation.  Labor: inaqequate, will augment labor with oxytocin Preeclampsia:  no evidence Fetal Wellbeing:  Category I Pain Control:  Labor support without medications I/D:  n/a Anticipated MOD:  undetermined yet, attempting vaginal birth of breech.  Paige Novak V 11/29/2011, 4:15 AM

## 2011-11-29 NOTE — MAU Note (Signed)
Dr. Emelda Fear at bedside discussing at length results of [place] and fetal presentation and making C-section recommendations. Patient refuses C-section at this time despite risks being discussed with patient.  Patient given AMA form to sign and witnessed by C. Rolanda Lundborg, Charity fundraiser (house Engineer, petroleum) and Cammy Copa, RN.

## 2011-11-29 NOTE — MAU Note (Signed)
Patient transferred to room 170 via wheelchair.

## 2011-11-29 NOTE — Progress Notes (Signed)
Attending Progress Note  Patient with recurrent deep variable decelerations with baseline in the 130s, cervix is 4 cm dilated, breech presentation. No evidence of umbilical cord prolapse. Patient counseled regarding need for cesarean section.  The risks of cesarean section discussed with the patient included but were not limited to: bleeding which may require transfusion or reoperation; infection which may require antibiotics; injury to bowel, bladder, ureters or other surrounding organs; injury to the fetus; need for additional procedures including hysterectomy in the event of a life-threatening hemorrhage and other postoperative/anesthesia complications. The patient concurred with the proposed plan, giving informed written consent for the procedure. Anesthesia and OR aware. Patient already on Vancomycin, SCDs ordered on call to the OR.  To OR when ready.  Jaynie Collins, MD, FACOG Attending Obstetrician & Gynecologist Faculty Practice, [facility]

## 2011-11-29 NOTE — Anesthesia Postprocedure Evaluation (Signed)
  Anesthesia Post-op Note  Patient: Paige Novak  Procedure(s) Performed: Procedure(s) (LRB) with comments: VAGINAL DELIVERY (N/A) - Patient was suppose to be having cesarean section but when she laid down after getting spinal feet of baby was showing in perineum so she was delivered vaginally by Dr. Marquis Lunch. Anyanwu and Dr. Sherryl Manges.  Patient Location: PACU and Mother/Baby  Anesthesia Type: Epidural  Level of Consciousness: awake, alert  and oriented  Airway and Oxygen Therapy: Patient Spontanous Breathing   :  Post-op Assessment: Patient's Cardiovascular Status Stable and Respiratory Function Stable  Post-op Vital Signs: stable  Complications: No apparent anesthesia complications

## 2011-11-29 NOTE — Progress Notes (Signed)
POC for C/S initiated per Dr Macon Large and Dr Emelda Fear

## 2011-11-29 NOTE — Transfer of Care (Signed)
Immediate Anesthesia Transfer of Care Note  Patient: Paige Novak  Procedure(s) Performed: Procedure(s) (LRB) with comments: CESAREAN SECTION (N/A)  Patient Location: L and D   Anesthesia Type: Spinal  Level of Consciousness: awake, alert  and oriented  Airway & Oxygen Therapy: Patient Spontanous Breathing  Post-op Assessment: Report given to PACU RN and Post -op Vital signs reviewed and stable  Post vital signs: Reviewed and stable  Complications: No apparent anesthesia complications
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
## 2011-11-29 NOTE — Anesthesia Procedure Notes (Signed)
Spinal  Patient location during procedure: OR Start time: 11/29/2011 8:52 AM End time: 11/29/2011 9:00 AM Staffing Anesthesiologist: Sandrea Hughs Performed by: anesthesiologist  Preanesthetic Checklist Completed: patient identified, site marked, surgical consent, pre-op evaluation, timeout performed, IV checked, risks and benefits discussed and monitors and equipment checked Spinal Block Patient position: sitting Prep: DuraPrep Patient monitoring: heart rate, cardiac monitor, continuous pulse ox and blood pressure Approach: midline Location: L3-4 Injection technique: single-shot Needle Needle type: Sprotte  Needle gauge: 24 G Needle length: 12.7 cm Needle insertion depth: 10 cm Assessment Sensory level: T4

## 2011-11-29 NOTE — Addendum Note (Signed)
Addendum  created 11/29/11 1246 by Earmon Phoenix, CRNA   Modules edited:Anesthesia Medication Administration

## 2011-11-29 NOTE — Consult Note (Signed)
Neonatology Note:  Attendance at Delivery:  I was asked to attend this breech vaginal delivery at term (patient refused C/S delivery). The mother is a G5P4 AB neg, GBS pos with a history of Hep B infection, currently negative. ROM 24 hours prior to delivery, fluid clear. Mother received Vancomycin 6 hours prior to delivery. At delivery, the baby had passed large amounts of meconium and was blue, floppy, and apneic, but with a HR of about 100. We bulb suctioned, then applied PPV for 1 minute. His color improved, HR was stable at > 100, and he began to cough, then breathe at about 2 minutes of life. He cried at 3 minutes and remained pink , with tone improving to normal by 5 minutes. 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 I discussed this briefly with the mother and aunt in the DR. Ap 2/9. Lungs clear to ausc in DR. To CN to care of Pediatrician.  Maxcine Strong, MD  

## 2011-11-29 NOTE — MAU Note (Signed)
Attempting to find IV site prior to transfer.  IV site difficult to find.  LD RN called to notify of difficulty finding appropriate site and will have RN in LD assess.

## 2011-11-29 NOTE — Anesthesia Postprocedure Evaluation (Signed)
Anesthesia Post Note  Patient: Paige Novak  Procedure(s) Performed: Procedure(s) (LRB): CESAREAN SECTION (N/A)  Anesthesia type: Spinal  Patient location: PACU  Post pain: Pain level controlled  Post assessment: Post-op Vital signs reviewed  Last Vitals:  Filed Vitals:   11/29/11 1015  BP: 116/67  Pulse: 89  Temp:   Resp: 16    Post vital signs: Reviewed  Level of consciousness: awake  Complications: No apparent anesthesia complications

## 2011-11-29 NOTE — Progress Notes (Signed)
Pitocin begun per MD order.  Patient aware of risks associated with pitocin augmentation with breech presentation.  Informed consent of risks associated with breech vaginal delivery signed.
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
## 2011-11-30 ENCOUNTER — Encounter (HOSPITAL_COMMUNITY): Payer: Self-pay | Admitting: Obstetrics and Gynecology

## 2011-11-30 LAB — CBC
MCHC: 31 g/dL (ref 30.0–36.0)
Platelets: 159 10*3/uL (ref 150–400)
RDW: 13.3 % (ref 11.5–15.5)
WBC: 10.7 10*3/uL — ABNORMAL HIGH (ref 4.0–10.5)

## 2011-11-30 NOTE — Progress Notes (Signed)
Post Partum Day 1 Subjective: no complaints, up ad lib, voiding, tolerating PO, + flatus and denies discomfort  Objective: Blood pressure 97/63, pulse 87, temperature 97.9 F (36.6 C), temperature source Oral, resp. rate 18, height 5\' 4"  (1.626 m), weight 107.559 kg (237 lb 2 oz), SpO2 97.00%, unknown if currently breastfeeding.  Physical Exam:  General: alert, cooperative and less nonverbal, no hostility today Lochia: appropriate Uterine Fundus: firm at u-0 Incision: none  DVT Evaluation: No evidence of DVT seen on physical exam.   Basename 11/30/11 0520 11/29/11 0105  HGB 8.6* 9.2*  HCT 27.7* 29.6*    Assessment/Plan: Plan for discharge tomorrow, Breastfeeding and Contraception IUD   LOS: 2 days   Kerry-Anne Mezo V 11/30/2011, 9:04 AM

## 2011-11-30 NOTE — Addendum Note (Signed)
Addendum  created 11/30/11 0913 by Earmon Phoenix, CRNA   Modules edited:Charges VN

## 2011-11-30 NOTE — Addendum Note (Signed)
Addendum  created 11/30/11 1610 by Earmon Phoenix, CRNA   Modules edited:Charges VN, Notes Section
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
## 2011-11-30 NOTE — Anesthesia Postprocedure Evaluation (Signed)
  Anesthesia Post-op Note  Patient: Paige Novak  Procedure(s) Performed: Procedure(s) (LRB) with comments: VAGINAL DELIVERY (N/A) - Patient was suppose to be having cesarean section but when she laid down after getting spinal feet of baby was showing in perineum so she was delivered vaginally by Dr. Marquis Lunch. Anyanwu and Dr. Sherryl Manges.  Patient Location: Mother/Baby  Anesthesia Type: Spinal  Level of Consciousness: awake, alert  and oriented  Airway and Oxygen Therapy: Patient Spontanous Breathing  Post-op Pain: mild  Post-op Assessment: Patient's Cardiovascular Status Stable, Respiratory Function Stable, No headache, No backache and No residual numbness  Post-op Vital Signs: Reviewed and stable  Complications: No apparent anesthesia complications

## 2011-11-30 NOTE — Addendum Note (Signed)
Addendum  created 11/30/11 9629 by Earmon Phoenix, CRNA   Modules edited:Charges VN, Notes Section

## 2011-12-01 MED ORDER — IBUPROFEN 600 MG PO TABS: 600.0000 mg | ORAL_TABLET | Freq: Four times a day (QID) | ORAL | Status: DC

## 2011-12-01 MED ORDER — IBUPROFEN 200 MG PO TABS: 200.0000 mg | ORAL_TABLET | Freq: Four times a day (QID) | ORAL | Status: DC | PRN

## 2011-12-01 MED ORDER — HYDROCODONE-ACETAMINOPHEN 5-500 MG PO TABS: 1.0000 | ORAL_TABLET | ORAL | Status: DC | PRN

## 2011-12-01 NOTE — Discharge Summary (Signed)
OLINA MELFI is a 31 y.o. [id number] at [redacted]w[redacted]d by ultrasound admitted for PROM, breech, complete Sherryn A Broadfoot is a 31 y.o. female presenting for eval of 'peeing constantly' since 0930a. No dysuria. Does not feel like this is amniotic fluid. U/S from 9/5 shows transverse lie; pt sched for ext version 9/19 but overslept and didn't make it in. Denies bldg; reports +FM. She has been [facility].   LIMITED OBSTETRIC ULTRASOUND  Number of Fetuses: 1  Heart Rate: 134 bpm  Movement: Yes  Presentation: Compound breech with feet and cervix.  Placental Location: Anterior, right  Previa: No  Amniotic Fluid (Subjective): Anhydramnios.  Vertical pocket: Zero cm AFI: Zero cm)  BPD: 9.78cm 40w 1d EDC: 11/28/2011  MATERNAL FINDINGS:  Cervix: Not well evaluated.  Uterus/Adnexae: Left ovary within normal limits. Right ovary not  visualize.  IMPRESSION:  1. Single living intrauterine pregnancy.  2. Compound breech presentation with feet and cervix.  3. Anhydramnios.  Assessment/Plan:  IUP at 38.4wks  Breech (complete)  Anhydramnios  Will notify Dr Emelda Fear and prep for C/S  Patient has been counselled for cesarean section, and refused to consider cesarean at this time. We had a witnessed long conversation about the Increased risks associated with vaginal breech delivery, with discussed risks including fetal injury, permanent or temporary, or even fetal death discussed. The patient is firm in her refusal for cesarean delivery. She states the would wish to consider cesarean if the baby showed evidence on the monitor that it was in distress, having trouble with the labor. Informed refusal forms prepared and signed by the patient.  Additionally patient is having pink discharge that on exam is fern POSITIVE, and cervix is 2cm/ 50%/-2 with feet and buttocks palpable.  Tilda Burrow MD   Attending Progress Note  Patient with recurrent deep variable decelerations with  baseline in the 130s, cervix is 4 cm dilated, breech presentation. No evidence of umbilical cord prolapse. Patient counseled regarding need for cesarean section.  The risks of cesarean section discussed with the patient included but were not limited to: bleeding which may require transfusion or reoperation; infection which may require antibiotics; injury to bowel, bladder, ureters or other surrounding organs; injury to the fetus; need for additional procedures including hysterectomy in the event of a life-threatening hemorrhage and other postoperative/anesthesia complications. The patient concurred with the proposed plan, giving informed written consent for the procedure. Anesthesia and OR aware. Patient already on Vancomycin, SCDs ordered on call to the OR. To OR when ready.  Jaynie Collins, MD, FACOG  Attending Obstetrician & Gynecologist  Faculty Practice, [facility]  Tereso Newcomer, MD Physician Signed Obstetrics L&D Delivery 11/29/2011 9:55 AM   Delivery Note  Patient was in the OR in preparation for cesarean section. After receiving spinal anesthesia, she was noted to be fully dilated with fetal feet out of the vagina and most of fetal body in vagina. The decision was discussed with the patient, and she consented to trial of vaginal breech extraction. Dr. Christin Bach was called to assist with the breech delivery. Given recent spinal anesthesia, the patient was not able to push adequately, and required some help via fundal pressure from Dr. Emelda Fear. The difficult part of the delivery was delivery of the arms which were noted to be extended above fetal head. With some difficulty, the right arm was flexed and delivered, and then it was less difficult to deliver the left arm. The head delivered  without much difficulty.  At 9:12 AM a viable female was delivered. APGAR: 2, 9; arterial cord pH 7.21. He was noted to have difficulty in moving right arm, left arm appeared to have normal  mobility. There were also superficial scratches on baby's torso and neck sustained during delivery. After the first minute, he started crying and moving vigorously, no other anomalies noted. Please refer to neonatology staff notes for further details.  Placenta status: Intact, Spontaneous. Cord: 3 vessels  Anesthesia: Spinal  Episiotomy: None  Lacerations: 1st degree;Perineal, not repaired  Est. Blood Loss (mL): 400 ml  Mom to L&D. Baby to nursery-stable.         Post Partum Day2  Subjective: no complaints, up ad lib, voiding, tolerating PO and + flatus  Objective: Blood pressure 105/74, pulse 69, temperature 98.6 F (37 C), temperature source Oral, resp. rate 18, height 5\' 4"  (1.626 m), weight 107.559 kg (237 lb 2 oz), SpO2 97.00%, unknown if currently breastfeeding. She is breast feeding, Birth control plans are for IUD thru the health dept. Physical Exam:  General: alert and answers questions appropriately, she repeatedly describes "everythiing is fine"  Social work eval unremarkable The baby is progressively using it's arm more and more.  Using elbow to flex, less  Left shoulder use than right. Lochia: appropriate Uterine Fundus: firm Incision: no incision, denies vulvar or abdominal discomfort DVT Evaluation: No evidence of DVT seen on physical exam.   Basename 11/30/11 0520 11/29/11 0105  HGB 8.6* 9.2*  HCT 27.7* 29.6*    Assessment/Plan: Discharge home and Breastfeeding   LOS: 3 days   Rody Keadle V 12/01/2011, 6:37 AM

## 2011-12-01 NOTE — Progress Notes (Signed)
Ur chart review completed.  

## 2011-12-02 ENCOUNTER — Encounter: Payer: Self-pay | Admitting: Cardiovascular Disease

## 2011-12-02 ENCOUNTER — Encounter: Payer: Self-pay | Admitting: *Deleted

## 2011-12-02 DIAGNOSIS — IMO0002 Reserved for concepts with insufficient information to code with codable children: Secondary | ICD-10-CM | POA: Insufficient documentation

## 2011-12-02 DIAGNOSIS — R011 Cardiac murmur, unspecified: Secondary | ICD-10-CM | POA: Insufficient documentation

## 2011-12-02 DIAGNOSIS — E669 Obesity, unspecified: Secondary | ICD-10-CM | POA: Insufficient documentation

## 2011-12-02 DIAGNOSIS — D649 Anemia, unspecified: Secondary | ICD-10-CM | POA: Insufficient documentation

## 2011-12-02 DIAGNOSIS — Z8619 Personal history of other infectious and parasitic diseases: Secondary | ICD-10-CM | POA: Insufficient documentation

## 2011-12-02 LAB — TYPE AND SCREEN
ABO/RH(D): AB POS
Antibody Screen: NEGATIVE
Unit division: 0

## 2011-12-03 ENCOUNTER — Encounter: Payer: Self-pay | Admitting: Cardiovascular Disease

## 2011-12-03 ENCOUNTER — Ambulatory Visit (INDEPENDENT_AMBULATORY_CARE_PROVIDER_SITE_OTHER): Payer: Medicaid Other | Admitting: Cardiovascular Disease

## 2011-12-03 VITALS — BP 112/70 | HR 69 | Ht 64.0 in | Wt 229.0 lb

## 2011-12-03 DIAGNOSIS — D649 Anemia, unspecified: Secondary | ICD-10-CM

## 2011-12-03 DIAGNOSIS — R011 Cardiac murmur, unspecified: Secondary | ICD-10-CM

## 2011-12-03 NOTE — Patient Instructions (Signed)
Your physician recommends that you schedule a follow-up appointment in:  AS NEEDED Your physician recommends that you continue on your current medications as directed. Please refer to the Current Medication list given to you today. Your physician has requested that you have an echocardiogram. Echocardiography is a painless test that uses sound waves to create images of your heart. It provides your doctor with information about the size and shape of your heart and how well your heart's chambers and valves are working. This procedure takes approximately one hour. There are no restrictions for this procedure. DX MURMUR 

## 2011-12-03 NOTE — Progress Notes (Signed)
Patient ID: Paige Novak, female   DOB: [birth year], 31 y.o.   MRN: [id number] 31 yo gave birth to 5th child 4 days ago.  Referred for murmur.  All uncomplicated vaginal deliveries.  Baby boy 8 lbs and healthy.  No previous congenital or other heart disease.  No chest pain palpitations or dyspnea.  Anemic post delivery.  Has not indicated previous description of murmur.  No history of rheumatic fever.  No history of diet pill use.    ROS: Denies fever, malais, weight loss, blurry vision, decreased visual acuity, cough, sputum, SOB, hemoptysis, pleuritic pain, palpitaitons, heartburn, abdominal pain, melena, lower extremity edema, claudication, or rash.  All other systems reviewed and negative   General: Affect appropriate Overweight black female HEENT: normal Neck supple with no adenopathy JVP normal no bruits no thyromegaly Lungs clear with no wheezing and good diaphragmatic motion Heart:  S1/S2 soft SEM  No rub, gallop or click PMI normal Abdomen: benighn, BS positve, no tenderness, no AAA no bruit.  No HSM or HJR Distal pulses intact with no bruits No edema Neuro non-focal Skin warm and dry No muscular weakness  Medications Current Outpatient Prescriptions  Medication Sig Dispense Refill  . ferrous sulfate (FERROUSUL) 325 (65 FE) MG tablet Take 1 tablet (325 mg total) by mouth daily with breakfast.  30 tablet  11  . HYDROcodone-acetaminophen (VICODIN) 5-500 MG per tablet Take 1 tablet by mouth every 4 (four) hours as needed for pain.  20 tablet  0  . ibuprofen (ADVIL,MOTRIN) 600 MG tablet Take 1 tablet (600 mg total) by mouth every 6 (six) hours.  30 tablet  0  . ibuprofen (MOTRIN IB) 200 MG tablet Take 1 tablet (200 mg total) by mouth every 6 (six) hours as needed for pain.  30 tablet  0  . Prenatal Vit-Fe Fumarate-FA (PRENATAL MULTIVITAMIN) TABS Take 1 tablet by mouth daily.        Allergies Penicillins  Family History: Family History  Problem Relation Age of Onset  .  Hypertension Father   . Diabetes Father   . Asthma Son   . Hypertension Maternal Grandmother   . Diabetes Maternal Grandmother     Social History: History   Social History  . Marital Status: Single    Spouse Name: N/A    Number of Children: N/A  . Years of Education: N/A   Occupational History  . Not on file.   Social History Main Topics  . Smoking status: Never Smoker   . Smokeless tobacco: Never Used  . Alcohol Use: No  . Drug Use: No  . Sexually Active: Not Currently    Birth Control/ Protection: IUD   Other Topics Concern  . Not on file   Social History Narrative  . No narrative on file    Electrocardiogram:  NSR rate 62  Normal ECG  Assessment and Plan

## 2011-12-03 NOTE — Assessment & Plan Note (Signed)
Continue iron replacement  May be contributing to murmur via increased CO

## 2011-12-03 NOTE — Assessment & Plan Note (Signed)
Benign sounding with normal ECG and no previous history  Echo and will also help make sure there is no post partum DCM

## 2011-12-09 NOTE — MAU Provider Note (Signed)
Attestation of Attending Supervision of Advanced Practitioner: Evaluation and management procedures were performed by the PA/NP/CNM/OB Fellow under my supervision/collaboration. Chart reviewed and agree with management and plan. Patient REFUSES CESAREAN.See my note at admission. Informed refusal of recommended care reviewed. With patient and signed.  Lilyanna Lunt V 12/09/2011 3:54 PM

## 2011-12-12 ENCOUNTER — Ambulatory Visit (HOSPITAL_COMMUNITY): Payer: Medicaid Other | Attending: Internal Medicine

## 2011-12-12 DIAGNOSIS — I059 Rheumatic mitral valve disease, unspecified: Secondary | ICD-10-CM | POA: Insufficient documentation

## 2011-12-12 DIAGNOSIS — R011 Cardiac murmur, unspecified: Secondary | ICD-10-CM | POA: Insufficient documentation

## 2011-12-12 DIAGNOSIS — I369 Nonrheumatic tricuspid valve disorder, unspecified: Secondary | ICD-10-CM | POA: Insufficient documentation

## 2011-12-12 NOTE — Progress Notes (Signed)
Echocardiogram performed.  

## 2013-05-30 ENCOUNTER — Other Ambulatory Visit: Payer: Self-pay | Admitting: Internal Medicine

## 2013-05-30 DIAGNOSIS — N920 Excessive and frequent menstruation with regular cycle: Secondary | ICD-10-CM

## 2013-06-03 ENCOUNTER — Ambulatory Visit
Admission: RE | Admit: 2013-06-03 | Discharge: 2013-06-03 | Disposition: A | Discharge status: Home / Self Care | Payer: Medicaid Other | Source: Ambulatory Visit | Attending: Internal Medicine | Admitting: Internal Medicine

## 2013-06-03 DIAGNOSIS — N920 Excessive and frequent menstruation with regular cycle: Secondary | ICD-10-CM

## 2013-07-07 ENCOUNTER — Encounter: Payer: Self-pay | Admitting: Advanced Practice Midwife

## 2013-08-09 ENCOUNTER — Encounter: Payer: Self-pay | Admitting: Advanced Practice Midwife

## 2013-08-09 ENCOUNTER — Ambulatory Visit (INDEPENDENT_AMBULATORY_CARE_PROVIDER_SITE_OTHER): Payer: Medicaid Other | Admitting: Advanced Practice Midwife

## 2013-08-09 VITALS — Temp 98.4°F | Ht 64.0 in | Wt 214.0 lb

## 2013-08-09 DIAGNOSIS — D649 Anemia, unspecified: Secondary | ICD-10-CM

## 2013-08-09 DIAGNOSIS — Z113 Encounter for screening for infections with a predominantly sexual mode of transmission: Secondary | ICD-10-CM

## 2013-08-09 DIAGNOSIS — Z3009 Encounter for other general counseling and advice on contraception: Secondary | ICD-10-CM

## 2013-08-09 DIAGNOSIS — N920 Excessive and frequent menstruation with regular cycle: Secondary | ICD-10-CM

## 2013-08-09 LAB — CBC
HEMATOCRIT: 29.7 % — AB (ref 36.0–46.0)
HEMOGLOBIN: 9.6 g/dL — AB (ref 12.0–15.0)
MCH: 24.3 pg — AB (ref 26.0–34.0)
MCHC: 32.3 g/dL (ref 30.0–36.0)
MCV: 75.2 fL — ABNORMAL LOW (ref 78.0–100.0)
Platelets: 220 10*3/uL (ref 150–400)
RBC: 3.95 MIL/uL (ref 3.87–5.11)
RDW: 14.8 % (ref 11.5–15.5)
WBC: 6.4 10*3/uL (ref 4.0–10.5)
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
## 2013-08-10 LAB — GC/CHLAMYDIA PROBE AMP
CT PROBE, AMP APTIMA: NEGATIVE
GC Probe RNA: NEGATIVE

## 2013-08-12 ENCOUNTER — Encounter: Payer: Self-pay | Admitting: Advanced Practice Midwife

## 2013-08-12 ENCOUNTER — Ambulatory Visit (INDEPENDENT_AMBULATORY_CARE_PROVIDER_SITE_OTHER): Payer: Medicaid Other | Admitting: Advanced Practice Midwife

## 2013-08-12 VITALS — BP 103/68 | HR 63 | Temp 98.6°F | Wt 207.0 lb

## 2013-08-12 DIAGNOSIS — Z30430 Encounter for insertion of intrauterine contraceptive device: Secondary | ICD-10-CM

## 2013-08-12 DIAGNOSIS — IMO0001 Reserved for inherently not codable concepts without codable children: Secondary | ICD-10-CM

## 2013-08-12 DIAGNOSIS — Z30432 Encounter for removal of intrauterine contraceptive device: Secondary | ICD-10-CM

## 2013-08-12 MED ORDER — LEVONORGESTREL 20 MCG/24HR IU IUD: 1.0000 | INTRAUTERINE_SYSTEM | Freq: Once | INTRAUTERINE | Status: DC

## 2013-08-12 NOTE — Progress Notes (Signed)
IUD Procedure Note   DIAGNOSIS: Desires long-term, reversible contraception and improvement of menorrhagia and secondary anemia (possibly secondary from paragard IUD)   PROCEDURE: Paragard IUD removal and Mirena placement Performing Provider: Dory Horn CNM  Patient counseled prior to procedure. I explained risks and benefits of Mirena IUD, reviewed alternative forms of contraception. Patient stated understanding and consented to continue with procedure. Patient experiencing menorrhagia w/ Paragard IUD. Patient has anemia, fatigue and light headed.    LMP: see previous note Pregnancy Test: Negative Lot #: [id number] Expiration Date: 10/2015   IUD Type: Mirena  PROCEDURE:  Timeout procedure was performed to ensure right patient and right site.  A bimanual exam was performed to determine the position of the uterus, retroverted. I palpated a mass in the posterior portion of the vagina less than pea size, unable to visualize w/ speculum today. The speculum was placed. The vagina and cervix were sterilized in the usual manner w/ betadine and sterile technique was maintained throughout the course of the procedure. A single toothed tenaculum was applied to the posterior lip of the cervix and gentle traction applied. The depth of the uterus was sounded to 9. With gentle traction on the tenaculum, the IUD was inserted to the appropriate depth and inserted without difficulty.  The string was cut to an estimated 4 cm length. Bleeding was minimal. The patient tolerated the procedure well.   Follow up: The patient tolerated the procedure well without complications.  Standard post-procedure care is explained and return precautions are given.  Patient to RTC for annual exam and evaluation of mass in posterior cuff of the vagina.  Melchor Kirchgessner Wilson Singer CNM

## 2013-09-06 ENCOUNTER — Ambulatory Visit (INDEPENDENT_AMBULATORY_CARE_PROVIDER_SITE_OTHER): Payer: Medicaid Other | Admitting: Advanced Practice Midwife

## 2013-09-06 ENCOUNTER — Encounter: Payer: Self-pay | Admitting: Advanced Practice Midwife

## 2013-09-06 VITALS — BP 112/70 | HR 50 | Temp 98.5°F | Ht 64.0 in | Wt 210.0 lb

## 2013-09-06 DIAGNOSIS — Z01419 Encounter for gynecological examination (general) (routine) without abnormal findings: Secondary | ICD-10-CM

## 2013-09-06 DIAGNOSIS — Z0000 Encounter for general adult medical examination without abnormal findings: Secondary | ICD-10-CM
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
## 2013-09-07 LAB — PAP IG AND HPV HIGH-RISK: HPV DNA High Risk: NOT DETECTED

## 2013-10-25 ENCOUNTER — Telehealth: Payer: Self-pay | Admitting: *Deleted

## 2013-10-25 NOTE — Telephone Encounter (Signed)
Placed call to pt to verify need for IUD.  We currently have a Mirena in stock for pt and need to know if she plans to have it placed. Pt was no available at time of call, LM with pt mother to call office.

## 2013-10-26 ENCOUNTER — Emergency Department (HOSPITAL_COMMUNITY)
Admission: EM | Admit: 2013-10-26 | Discharge: 2013-10-26 | Disposition: A | Discharge status: Home / Self Care | Payer: Medicaid Other | Attending: Emergency Medicine | Admitting: Emergency Medicine

## 2013-10-26 DIAGNOSIS — Z3202 Encounter for pregnancy test, result negative: Secondary | ICD-10-CM | POA: Insufficient documentation

## 2013-10-26 DIAGNOSIS — Z862 Personal history of diseases of the blood and blood-forming organs and certain disorders involving the immune mechanism: Secondary | ICD-10-CM | POA: Insufficient documentation

## 2013-10-26 DIAGNOSIS — Z8619 Personal history of other infectious and parasitic diseases: Secondary | ICD-10-CM | POA: Insufficient documentation

## 2013-10-26 DIAGNOSIS — R011 Cardiac murmur, unspecified: Secondary | ICD-10-CM | POA: Diagnosis not present

## 2013-10-26 DIAGNOSIS — R079 Chest pain, unspecified: Secondary | ICD-10-CM | POA: Insufficient documentation

## 2013-10-26 DIAGNOSIS — R5381 Other malaise: Secondary | ICD-10-CM | POA: Insufficient documentation

## 2013-10-26 DIAGNOSIS — R0602 Shortness of breath: Secondary | ICD-10-CM | POA: Insufficient documentation

## 2013-10-26 DIAGNOSIS — R5383 Other fatigue: Secondary | ICD-10-CM

## 2013-10-26 DIAGNOSIS — R000 Tachycardia, unspecified: Secondary | ICD-10-CM | POA: Diagnosis not present

## 2013-10-26 DIAGNOSIS — E669 Obesity, unspecified: Secondary | ICD-10-CM | POA: Diagnosis not present

## 2013-10-26 DIAGNOSIS — R42 Dizziness and giddiness: Secondary | ICD-10-CM | POA: Diagnosis not present

## 2013-10-26 LAB — BASIC METABOLIC PANEL
ANION GAP: 10 (ref 5–15)
BUN: 15 mg/dL (ref 6–23)
CALCIUM: 9.5 mg/dL (ref 8.4–10.5)
CO2: 26 mEq/L (ref 19–32)
CREATININE: 0.84 mg/dL (ref 0.50–1.10)
Chloride: 102 mEq/L (ref 96–112)
GFR calc Af Amer: >90 mL/min (ref >90)
GFR calc non Af Amer: 89 mL/min — ABNORMAL LOW (ref >90)
Glucose, Bld: 86 mg/dL (ref 70–99)
Potassium: 3.9 mEq/L (ref 3.7–5.3)
Sodium: 138 mEq/L (ref 137–147)

## 2013-10-26 LAB — URINALYSIS, ROUTINE W REFLEX MICROSCOPIC
Bilirubin Urine: NEGATIVE
Glucose, UA: NEGATIVE mg/dL
HGB URINE DIPSTICK: NEGATIVE
Ketones, ur: 15 mg/dL — AB
Leukocytes, UA: NEGATIVE
Nitrite: NEGATIVE
PH: 6.5 (ref 5.0–8.0)
PROTEIN: NEGATIVE mg/dL
Specific Gravity, Urine: 1.028 (ref 1.005–1.030)
Urobilinogen, UA: 0.2 mg/dL (ref 0.0–1.0)

## 2013-10-26 LAB — CBC
HEMATOCRIT: 33.1 % — AB (ref 36.0–46.0)
Hemoglobin: 10.3 g/dL — ABNORMAL LOW (ref 12.0–15.0)
MCH: 24.2 pg — AB (ref 26.0–34.0)
MCHC: 31.1 g/dL (ref 30.0–36.0)
MCV: 77.7 fL — AB (ref 78.0–100.0)
PLATELETS: 239 10*3/uL (ref 150–400)
RBC: 4.26 MIL/uL (ref 3.87–5.11)
RDW: 13.5 % (ref 11.5–15.5)
WBC: 6.7 10*3/uL (ref 4.0–10.5)

## 2013-10-26 LAB — I-STAT TROPONIN, ED: Troponin i, poc: 0 ng/mL (ref 0.00–0.08)

## 2013-10-26 LAB — POC URINE PREG, ED: PREG TEST UR: NEGATIVE

## 2013-10-26 NOTE — Telephone Encounter (Signed)
Pt returned call.  Pt states that she has had an IUD already placed in our office.

## 2013-10-26 NOTE — ED Notes (Signed)
~   12 MN: c/o cp when stacking boxes, lightheadedness, and heart racing. 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

## 2013-10-26 NOTE — ED Notes (Signed)
Labs and EKG done by [id number]
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
## 2013-10-26 NOTE — Discharge Instructions (Signed)
Please read and follow all provided instructions.  Your diagnoses today include:  1. Lightheadedness     Tests performed today include:  EKG - normal  Blood counts and electrolytes - mild anemia  Urine test - mild dehydration  Vital signs. See below for your results today.   Medications prescribed:   None  Take any prescribed medications only as directed.  Home care instructions:  Follow any educational materials contained in this packet.  BE VERY CAREFUL not to take multiple medicines containing Tylenol (also called acetaminophen). Doing so can lead to an overdose which can damage your liver and cause liver failure and possibly death.   Follow-up instructions: Please follow-up with your primary care provider in the next 7 days for further evaluation of your symptoms.   Return instructions:   Please return to the Emergency Department if you experience worsening symptoms.   Please return if you have any other emergent concerns.  Additional Information:  Your vital signs today were: BP 112/50   Pulse 59   Temp(Src) 98.2 F (36.8 C) (Oral)   Resp 16   Ht 5\' 4"  (1.626 m)   Wt 210 lb (95.255 kg)   BMI 36.03 kg/m2   SpO2 100%   LMP 10/13/2013 If your blood pressure (BP) was elevated above 135/85 this visit, please have this repeated by your doctor within one month. --------------

## 2013-10-27 NOTE — ED Provider Notes (Signed)
History/physical exam/procedure(s) were performed by non-physician practitioner and as supervising physician I was immediately available for consultation/collaboration. I have reviewed all notes and am in agreement with care and plan.   Hilario Quarryanielle S Leola Fiore, MD 10/27/13 [phone]

## 2014-01-08 ENCOUNTER — Encounter (HOSPITAL_COMMUNITY): Payer: Self-pay

## 2014-01-08 ENCOUNTER — Inpatient Hospital Stay (HOSPITAL_COMMUNITY)
Admission: EM | Admit: 2014-01-08 | Discharge: 2014-01-08 | DRG: 208 | Disposition: A | Discharge status: Other Acute Inpatient Hospital | Payer: Medicaid Other | Attending: General Surgery | Admitting: General Surgery

## 2014-01-08 ENCOUNTER — Emergency Department (HOSPITAL_COMMUNITY): Payer: Medicaid Other

## 2014-01-08 ENCOUNTER — Inpatient Hospital Stay (HOSPITAL_COMMUNITY): Payer: Medicaid Other

## 2014-01-08 DIAGNOSIS — X088XXA Exposure to other specified smoke, fire and flames, initial encounter: Secondary | ICD-10-CM | POA: Diagnosis present

## 2014-01-08 DIAGNOSIS — Y92019 Unspecified place in single-family (private) house as the place of occurrence of the external cause: Secondary | ICD-10-CM

## 2014-01-08 DIAGNOSIS — S51011A Laceration without foreign body of right elbow, initial encounter: Secondary | ICD-10-CM | POA: Diagnosis present

## 2014-01-08 DIAGNOSIS — T59811A Toxic effect of smoke, accidental (unintentional), initial encounter: Secondary | ICD-10-CM | POA: Diagnosis present

## 2014-01-08 DIAGNOSIS — S31119A Laceration without foreign body of abdominal wall, unspecified quadrant without penetration into peritoneal cavity, initial encounter: Secondary | ICD-10-CM | POA: Diagnosis present

## 2014-01-08 DIAGNOSIS — J705 Respiratory conditions due to smoke inhalation: Secondary | ICD-10-CM | POA: Diagnosis present

## 2014-01-08 DIAGNOSIS — S0012XA Contusion of left eyelid and periocular area, initial encounter: Secondary | ICD-10-CM | POA: Diagnosis present

## 2014-01-08 DIAGNOSIS — H1132 Conjunctival hemorrhage, left eye: Secondary | ICD-10-CM | POA: Diagnosis present

## 2014-01-08 DIAGNOSIS — R070 Pain in throat: Secondary | ICD-10-CM | POA: Diagnosis present

## 2014-01-08 DIAGNOSIS — Z4659 Encounter for fitting and adjustment of other gastrointestinal appliance and device: Secondary | ICD-10-CM

## 2014-01-08 DIAGNOSIS — R52 Pain, unspecified: Secondary | ICD-10-CM

## 2014-01-08 LAB — COMPREHENSIVE METABOLIC PANEL
ALK PHOS: 61 U/L (ref 39–117)
ALT: 24 U/L (ref 0–35)
ANION GAP: 24 — AB (ref 5–15)
AST: 47 U/L — ABNORMAL HIGH (ref 0–37)
Albumin: 3.7 g/dL (ref 3.5–5.2)
BUN: 10 mg/dL (ref 6–23)
CO2: 20 mEq/L (ref 19–32)
CREATININE: 0.89 mg/dL (ref 0.50–1.10)
Calcium: 9 mg/dL (ref 8.4–10.5)
Chloride: 98 mEq/L (ref 96–112)
GFR calc non Af Amer: 83 mL/min — ABNORMAL LOW (ref >90)
GLUCOSE: 173 mg/dL — AB (ref 70–99)
POTASSIUM: 2.6 meq/L — AB (ref 3.7–5.3)
Sodium: 142 mEq/L (ref 137–147)
TOTAL PROTEIN: 7.8 g/dL (ref 6.0–8.3)
Total Bilirubin: 0.6 mg/dL (ref 0.3–1.2)

## 2014-01-08 LAB — I-STAT CHEM 8, ED
BUN: 9 mg/dL (ref 6–23)
CALCIUM ION: 1.09 mmol/L — AB (ref 1.12–1.23)
CREATININE: 0.9 mg/dL (ref 0.50–1.10)
Chloride: 101 mEq/L (ref 96–112)
GLUCOSE: 174 mg/dL — AB (ref 70–99)
HCT: 39 % (ref 36.0–46.0)
Hemoglobin: 13.3 g/dL (ref 12.0–15.0)
Potassium: 2.3 mEq/L — CL (ref 3.7–5.3)
Sodium: 142 mEq/L (ref 137–147)
TCO2: 20 mmol/L (ref 0–100)

## 2014-01-08 LAB — I-STAT ARTERIAL BLOOD GAS, ED
ACID-BASE EXCESS: 5 mmol/L — AB (ref 0.0–2.0)
Bicarbonate: 28.7 mEq/L — ABNORMAL HIGH (ref 20.0–24.0)
O2 Saturation: 100 %
PCO2 ART: 39.5 mmHg (ref 35.0–45.0)
TCO2: 30 mmol/L (ref 0–100)
pH, Arterial: 7.47 — ABNORMAL HIGH (ref 7.350–7.450)
pO2, Arterial: 324 mmHg — ABNORMAL HIGH (ref 80.0–100.0)

## 2014-01-08 LAB — SAMPLE TO BLOOD BANK

## 2014-01-08 LAB — CBC
HCT: 33.6 % — ABNORMAL LOW (ref 36.0–46.0)
HEMOGLOBIN: 10.7 g/dL — AB (ref 12.0–15.0)
MCH: 24.9 pg — ABNORMAL LOW (ref 26.0–34.0)
MCHC: 31.8 g/dL (ref 30.0–36.0)
MCV: 78.1 fL (ref 78.0–100.0)
Platelets: 226 10*3/uL (ref 150–400)
RBC: 4.3 MIL/uL (ref 3.87–5.11)
RDW: 13.2 % (ref 11.5–15.5)
WBC: 6.6 10*3/uL (ref 4.0–10.5)

## 2014-01-08 LAB — MAGNESIUM: Magnesium: 1.8 mg/dL (ref 1.5–2.5)

## 2014-01-08 LAB — CREATININE, SERUM: CREATININE: 0.83 mg/dL (ref 0.50–1.10)

## 2014-01-08 LAB — CDS SEROLOGY

## 2014-01-08 LAB — CARBOXYHEMOGLOBIN
CARBOXYHEMOGLOBIN: 19.4 % — AB (ref 0.5–1.5)
Methemoglobin: 1.6 % — ABNORMAL HIGH (ref 0.0–1.5)
O2 Saturation: 97.2 %
Total hemoglobin: 9.9 g/dL — ABNORMAL LOW (ref 12.0–16.0)

## 2014-01-08 LAB — POTASSIUM: Potassium: 3 mEq/L — ABNORMAL LOW (ref 3.7–5.3)

## 2014-01-08 LAB — ETHANOL

## 2014-01-08 LAB — I-STAT CG4 LACTIC ACID, ED: Lactic Acid, Venous: 9.59 mmol/L — ABNORMAL HIGH (ref 0.5–2.2)

## 2014-01-08 MED ORDER — ROCURONIUM BROMIDE 50 MG/5ML IV SOLN
INTRAVENOUS | Status: AC | PRN
  Administered 2014-01-08: 75 mg via INTRAVENOUS

## 2014-01-08 MED ORDER — POTASSIUM CHLORIDE 10 MEQ/100ML IV SOLN
10.0000 meq | Freq: Once | INTRAVENOUS | Status: AC
  Administered 2014-01-08: 10 meq via INTRAVENOUS
  Filled 2014-01-08: qty 100

## 2014-01-08 MED ORDER — ONDANSETRON HCL 4 MG/2ML IJ SOLN
4.0000 mg | Freq: Once | INTRAMUSCULAR | Status: AC
  Administered 2014-01-08: 4 mg via INTRAVENOUS

## 2014-01-08 MED ORDER — LACTATED RINGERS IV SOLN
INTRAVENOUS | Status: AC | PRN
  Administered 2014-01-08: 1000 mL via INTRAVENOUS
  Administered 2014-01-08: 500 mL via INTRAVENOUS

## 2014-01-08 MED ORDER — SODIUM CHLORIDE 0.9 % IV SOLN
1.0000 mg/h | INTRAVENOUS | Status: DC
  Filled 2014-01-08: qty 10

## 2014-01-08 MED ORDER — MIDAZOLAM HCL 2 MG/2ML IJ SOLN: 4.0000 mg | Freq: Once | INTRAMUSCULAR | Status: DC

## 2014-01-08 MED ORDER — ROCURONIUM BROMIDE 50 MG/5ML IV SOLN
INTRAVENOUS | Status: AC
  Administered 2014-01-08: 75 mg
  Filled 2014-01-08: qty 2

## 2014-01-08 MED ORDER — SODIUM CHLORIDE 0.9 % IV SOLN
20.0000 ug/h | INTRAVENOUS | Status: DC
  Filled 2014-01-08: qty 50

## 2014-01-08 MED ORDER — HEPARIN SODIUM (PORCINE) 5000 UNIT/ML IJ SOLN: 5000.0000 [IU] | Freq: Three times a day (TID) | INTRAMUSCULAR | Status: DC

## 2014-01-08 MED ORDER — FENTANYL CITRATE 0.05 MG/ML IJ SOLN: 50.0000 ug | Freq: Once | INTRAMUSCULAR | Status: DC

## 2014-01-08 MED ORDER — SUCCINYLCHOLINE CHLORIDE 20 MG/ML IJ SOLN
INTRAMUSCULAR | Status: AC
  Filled 2014-01-08: qty 1

## 2014-01-08 MED ORDER — LIDOCAINE HCL (CARDIAC) 20 MG/ML IV SOLN
INTRAVENOUS | Status: AC
  Filled 2014-01-08: qty 5

## 2014-01-08 MED ORDER — POTASSIUM CHLORIDE IN NACL 40-0.9 MEQ/L-% IV SOLN: INTRAVENOUS | Status: DC

## 2014-01-08 MED ORDER — MORPHINE SULFATE 2 MG/ML IJ SOLN: 1.0000 mg | INTRAMUSCULAR | Status: DC | PRN

## 2014-01-08 MED ORDER — ETOMIDATE 2 MG/ML IV SOLN
INTRAVENOUS | Status: AC
  Administered 2014-01-08: 25 mg
  Filled 2014-01-08: qty 20

## 2014-01-08 NOTE — ED Notes (Signed)
Report given to the [facility], Family at the bedside and updated Transport paper also given to the transport team

## 2014-01-08 NOTE — ED Notes (Signed)
Pt. Is hypotensive, Warmed Lactated Ringers 500 cc bolus hung and infusing
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
## 2014-01-08 NOTE — ED Notes (Addendum)
FAMILY AT THE BEDSIDE, PT.S BELONGINGS GIVEN TO PT.S SISTER, GRACE, UPDATED FAMILY ON PLAN OF CARE

## 2014-01-08 NOTE — ED Provider Notes (Signed)
INTUBATION Date/Time: 01/08/2014 4:50 PM Performed by: Fredirick LatheWONG, Ayse Mccartin PETER Authorized by: Warnell ForesterWOFFORD, TREY Consent: The procedure was performed in an emergent situation. Risks and benefits: risks, benefits and alternatives were discussed Consent given by: patient Required items: required blood products, implants, devices, and special equipment available Patient identity confirmed: verbally with patient, arm band and hospital-assigned identification number Indications: airway protection Intubation method: video-assisted Patient status: paralyzed (RSI) Preoxygenation: nonrebreather mask and BVM Sedatives: etomidate Paralytic: rocuronium Tube size: 7.0 mm Tube type: cuffed Number of attempts: 1 Cords visualized: yes Post-procedure assessment: chest rise and ETCO2 monitor Breath sounds: equal Cuff inflated: yes ETT to lip: 21 cm Tube secured with: ETT holder Chest x-ray interpreted by me. Chest x-ray findings: endotracheal tube in appropriate position Patient tolerance: Patient tolerated the procedure well with no immediate complications  Directly supervised by my attending Dr. Lowella PettiesWofford  Taydem Cavagnaro Peter Ezell Melikian, MD 01/08/14 [phone]

## 2014-01-08 NOTE — ED Provider Notes (Signed)
Called to bedside secondary to worsening mental status and vomiting.  Reviewed labs notable for CarboxyHg of 19.4.  Pt intubated by Dr. Modesto CharonWong while I directly supervised the procedure.  Please see his procedure note.    Dr. Patria Maneampos [facility].      Paige Foresterrey Gabreil [place], MD 01/08/14 [phone]
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
## 2014-01-08 NOTE — ED Notes (Signed)
Pt. Has vomited  Large amount of emesis.  Assistance given airway maintained.  Zofran 4 mg IV given
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
## 2014-01-23 ENCOUNTER — Encounter (HOSPITAL_COMMUNITY): Payer: Self-pay | Admitting: *Deleted

## 2014-01-23 ENCOUNTER — Emergency Department (HOSPITAL_COMMUNITY)
Admission: EM | Admit: 2014-01-23 | Discharge: 2014-01-23 | Disposition: A | Discharge status: Home / Self Care | Payer: Medicaid Other | Attending: Emergency Medicine | Admitting: Emergency Medicine

## 2014-01-23 DIAGNOSIS — Z4801 Encounter for change or removal of surgical wound dressing: Secondary | ICD-10-CM | POA: Diagnosis present

## 2014-01-23 DIAGNOSIS — R011 Cardiac murmur, unspecified: Secondary | ICD-10-CM | POA: Diagnosis not present

## 2014-01-23 DIAGNOSIS — E669 Obesity, unspecified: Secondary | ICD-10-CM | POA: Diagnosis not present

## 2014-01-23 DIAGNOSIS — Z880 Allergy status to penicillin: Secondary | ICD-10-CM | POA: Diagnosis not present

## 2014-01-23 DIAGNOSIS — Z4802 Encounter for removal of sutures: Secondary | ICD-10-CM | POA: Insufficient documentation

## 2014-01-23 DIAGNOSIS — Z862 Personal history of diseases of the blood and blood-forming organs and certain disorders involving the immune mechanism: Secondary | ICD-10-CM | POA: Diagnosis not present

## 2014-01-23 DIAGNOSIS — Z8619 Personal history of other infectious and parasitic diseases: Secondary | ICD-10-CM | POA: Diagnosis not present
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
## 2014-01-23 NOTE — Discharge Instructions (Signed)
1. Medications: usual home medications 2. Treatment: rest, drink plenty of fluids, keep area clean with warm soap and water 3. Follow Up: Please followup with your primary doctor in as needed days for discussion of your diagnoses and further evaluation after today's visit; if you do not have a primary care doctor use the resource guide provided to find one; Please return to the ER for signs of infection including redness, warmth and drainage    Suture Removal, Care After Refer to this sheet in the next few weeks. These instructions provide you with information on caring for yourself after your procedure. Your health care provider may also give you more specific instructions. Your treatment has been planned according to current medical practices, but problems sometimes occur. Call your health care provider if you have any problems or questions after your procedure. WHAT TO EXPECT AFTER THE PROCEDURE After your stitches (sutures) are removed, it is typical to have the following:  Some discomfort and swelling in the wound area.  Slight redness in the area. HOME CARE INSTRUCTIONS   If you have skin adhesive strips over the wound area, do not take the strips off. They will fall off on their own in a few days. If the strips remain in place after 14 days, you may remove them.  Change any bandages (dressings) at least once a day or as directed by your health care provider. If the bandage sticks, soak it off with warm, soapy water.  Apply cream or ointment only as directed by your health care provider. If using cream or ointment, wash the area with soap and water 2 times a day to remove all the cream or ointment. Rinse off the soap and pat the area dry with a clean towel.  Keep the wound area dry and clean. If the bandage becomes wet or dirty, or if it develops a bad smell, change it as soon as possible.  Continue to protect the wound from injury.  Use sunscreen when out in the sun. New scars become  sunburned easily. SEEK MEDICAL CARE IF:  You have increasing redness, swelling, or pain in the wound.  You see pus coming from the wound.  You have a fever.  You notice a bad smell coming from the wound or dressing.  Your wound breaks open (edges not staying together). Document Released: 11/19/2000 Document Revised: 12/15/2012 Document Reviewed: 10/06/2012 [facility] Patient Information 2015 [place], [place]. This information is not intended to replace advice given to you by your health care provider. Make sure you discuss any questions you have with your health care provider.    Scar Minimization You will have a scar anytime you have surgery and a cut is made in the skin or you have something removed from your skin (mole, skin cancer, cyst). Although scars are unavoidable following surgery, there are ways to minimize their appearance. It is important to follow all the instructions you receive from your caregiver about wound care. How your wound heals will influence the appearance of your scar. If you do not follow the wound care instructions as directed, complications such as infection may occur. Wound instructions include keeping the wound clean, moist, and not letting the wound form a scab. Some people form scars that are raised and lumpy (hypertrophic) or larger than the initial wound (keloidal). HOME CARE INSTRUCTIONS   Follow wound care instructions as directed.  Keep the wound clean by washing it with soap and water.  Keep the wound moist with provided antibiotic cream  or petroleum jelly until completely healed. Moisten twice a day for about 2 weeks.  Get stitches (sutures) taken out at the scheduled time.  Avoid touching or manipulating your wound unless needed. Wash your hands thoroughly before and after touching your wound.  Follow all restrictions such as limits on exercise or work. This depends on where your scar is located.  Keep the scar protected from sunburn. Cover the  scar with sunscreen/sunblock with SPF 30 or higher.  Gently massage the scar using a circular motion to help minimize the appearance of the scar. Do this only after the wound has closed and all the sutures have been removed.  For hypertrophic or keloidal scars, there are several ways to treat and minimize their appearance. Methods include compression therapy, intralesional corticosteroids, laser therapy, or surgery. These methods are performed by your caregiver. Remember that the scar may appear lighter or darker than your normal skin color. This difference in color should even out with time. SEEK MEDICAL CARE IF:   You have a fever.  You develop signs of infection such as pain, redness, pus, and warmth.  You have questions or concerns. Document Released: 08/14/2009 Document Revised: 05/19/2011 Document Reviewed: 08/14/2009 [facility] Patient Information 2015 [place], [place]. This information is not intended to replace advice given to you by your health care provider. Make sure you discuss any questions you have with your health care provider.
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
## 2015-01-25 ENCOUNTER — Emergency Department (HOSPITAL_COMMUNITY)
Admission: EM | Admit: 2015-01-25 | Discharge: 2015-01-25 | Disposition: A | Discharge status: Home / Self Care | Payer: Medicaid Other | Attending: Emergency Medicine | Admitting: Emergency Medicine

## 2015-01-25 ENCOUNTER — Encounter (HOSPITAL_COMMUNITY): Payer: Self-pay

## 2015-01-25 DIAGNOSIS — E669 Obesity, unspecified: Secondary | ICD-10-CM | POA: Insufficient documentation

## 2015-01-25 DIAGNOSIS — R1084 Generalized abdominal pain: Secondary | ICD-10-CM | POA: Insufficient documentation

## 2015-01-25 DIAGNOSIS — R197 Diarrhea, unspecified: Secondary | ICD-10-CM | POA: Insufficient documentation

## 2015-01-25 DIAGNOSIS — Z880 Allergy status to penicillin: Secondary | ICD-10-CM | POA: Insufficient documentation

## 2015-01-25 DIAGNOSIS — R110 Nausea: Secondary | ICD-10-CM | POA: Insufficient documentation

## 2015-01-25 DIAGNOSIS — R011 Cardiac murmur, unspecified: Secondary | ICD-10-CM | POA: Insufficient documentation

## 2015-01-25 DIAGNOSIS — Z862 Personal history of diseases of the blood and blood-forming organs and certain disorders involving the immune mechanism: Secondary | ICD-10-CM | POA: Insufficient documentation

## 2015-01-25 DIAGNOSIS — Z8619 Personal history of other infectious and parasitic diseases: Secondary | ICD-10-CM | POA: Insufficient documentation

## 2015-01-25 LAB — COMPREHENSIVE METABOLIC PANEL
ALT: 24 U/L (ref 14–54)
AST: 21 U/L (ref 15–41)
Albumin: 3.4 g/dL — ABNORMAL LOW (ref 3.5–5.0)
Alkaline Phosphatase: 57 U/L (ref 38–126)
Anion gap: 6 (ref 5–15)
BUN: 6 mg/dL (ref 6–20)
CHLORIDE: 105 mmol/L (ref 101–111)
CO2: 24 mmol/L (ref 22–32)
CREATININE: 0.9 mg/dL (ref 0.44–1.00)
Calcium: 8.8 mg/dL — ABNORMAL LOW (ref 8.9–10.3)
GFR calc non Af Amer: >60 mL/min (ref >60)
Glucose, Bld: 98 mg/dL (ref 65–99)
POTASSIUM: 3.4 mmol/L — AB (ref 3.5–5.1)
SODIUM: 135 mmol/L (ref 135–145)
Total Bilirubin: 1.2 mg/dL (ref 0.3–1.2)
Total Protein: 7.2 g/dL (ref 6.5–8.1)

## 2015-01-25 LAB — CBC
HCT: 31.5 % — ABNORMAL LOW (ref 36.0–46.0)
HEMOGLOBIN: 9.6 g/dL — AB (ref 12.0–15.0)
MCH: 24.4 pg — ABNORMAL LOW (ref 26.0–34.0)
MCHC: 30.5 g/dL (ref 30.0–36.0)
MCV: 80.2 fL (ref 78.0–100.0)
PLATELETS: 189 10*3/uL (ref 150–400)
RBC: 3.93 MIL/uL (ref 3.87–5.11)
RDW: 13.1 % (ref 11.5–15.5)
WBC: 11.8 10*3/uL — AB (ref 4.0–10.5)

## 2015-01-25 LAB — LIPASE, BLOOD: LIPASE: 26 U/L (ref 11–51)

## 2015-01-25 LAB — POC OCCULT BLOOD, ED: Fecal Occult Bld: NEGATIVE

## 2015-01-25 MED ORDER — ONDANSETRON HCL 4 MG/2ML IJ SOLN
4.0000 mg | Freq: Once | INTRAMUSCULAR | Status: AC
  Administered 2015-01-25: 4 mg via INTRAVENOUS
  Filled 2015-01-25: qty 2

## 2015-01-25 MED ORDER — DICYCLOMINE HCL 20 MG PO TABS: 20.0000 mg | ORAL_TABLET | Freq: Two times a day (BID) | ORAL | Status: DC

## 2015-01-25 MED ORDER — MORPHINE SULFATE (PF) 4 MG/ML IV SOLN
4.0000 mg | INTRAVENOUS | Status: DC | PRN
  Administered 2015-01-25 (×2): 4 mg via INTRAVENOUS
  Filled 2015-01-25 (×2): qty 1

## 2015-01-25 MED ORDER — SODIUM CHLORIDE 0.9 % IV BOLUS (SEPSIS)
1000.0000 mL | Freq: Once | INTRAVENOUS | Status: AC
  Administered 2015-01-25: 1000 mL via INTRAVENOUS

## 2015-01-25 MED ORDER — SODIUM CHLORIDE 0.9 % IV SOLN
Freq: Once | INTRAVENOUS | Status: AC
  Administered 2015-01-25: 20:00:00 via INTRAVENOUS

## 2015-01-25 MED ORDER — ONDANSETRON 4 MG PO TBDP: 4.0000 mg | ORAL_TABLET | Freq: Three times a day (TID) | ORAL | Status: DC | PRN

## 2015-01-25 NOTE — Discharge Instructions (Signed)
Your symptoms are likely from a viral infection  Your blood tests are normal.  No abnormalities of your  Liver, gallbladder, pancreas or kidney tests. Home, push fluids. Recheck here with any worsening symptoms including high fever, bloody stools, or other changes in your symptoms.  Abdominal Pain, Adult Many things can cause abdominal pain. Usually, abdominal pain is not caused by a disease and will improve without treatment. It can often be observed and treated at home. Your health care provider will do a physical exam and possibly order blood tests and X-rays to help determine the seriousness of your pain. However, in many cases, more time must pass before a clear cause of the pain can be found. Before that point, your health care provider may not know if you need more testing or further treatment. HOME CARE INSTRUCTIONS Monitor your abdominal pain for any changes. The following actions may help to alleviate any discomfort you are experiencing:  Only take over-the-counter or prescription medicines as directed by your health care provider.  Do not take laxatives unless directed to do so by your health care provider.  Try a clear liquid diet (broth, tea, or water) as directed by your health care provider. Slowly move to a bland diet as tolerated. SEEK MEDICAL CARE IF:  You have unexplained abdominal pain.  You have abdominal pain associated with nausea or diarrhea.  You have pain when you urinate or have a bowel movement.  You experience abdominal pain that wakes you in the night.  You have abdominal pain that is worsened or improved by eating food.  You have abdominal pain that is worsened with eating fatty foods.  You have a fever. SEEK IMMEDIATE MEDICAL CARE IF:  Your pain does not go away within 2 hours.  You keep throwing up (vomiting).  Your pain is felt only in portions of the abdomen, such as the right side or the left lower portion of the abdomen.  You pass bloody or  black tarry stools. MAKE SURE YOU:  Understand these instructions.  Will watch your condition.  Will get help right away if you are not doing well or get worse.   This information is not intended to replace advice given to you by your health care provider. Make sure you discuss any questions you have with your health care provider.   Document Released: 12/04/2004 Document Revised: 11/15/2014 Document Reviewed: 11/03/2012 Elsevier Interactive Patient Education [employer]

## 2015-01-25 NOTE — ED Notes (Signed)
This RN entered the room after pt was discharged and discovered that the pt had intentionally stooled on the bed and sheets. Pt did not request to be toilet and did not inform staff if she had had an accident.
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
## 2015-01-25 NOTE — ED Notes (Signed)
Pt here with c/o generalized abdominal pain onset yesterday and she reports a high fever (did not take temperature but states she has been sweating a lot). Pt also reports diarrhea but not vomiting; she says she has not eaten today.

## 2015-02-08 ENCOUNTER — Emergency Department (HOSPITAL_COMMUNITY): Payer: Medicaid Other

## 2015-02-08 ENCOUNTER — Encounter (HOSPITAL_COMMUNITY): Payer: Self-pay | Admitting: Neurology

## 2015-02-08 ENCOUNTER — Emergency Department (HOSPITAL_COMMUNITY)
Admission: EM | Admit: 2015-02-08 | Discharge: 2015-02-09 | Disposition: A | Discharge status: Home / Self Care | Payer: Self-pay | Attending: Emergency Medicine | Admitting: Emergency Medicine

## 2015-02-08 DIAGNOSIS — E669 Obesity, unspecified: Secondary | ICD-10-CM | POA: Insufficient documentation

## 2015-02-08 DIAGNOSIS — Z3202 Encounter for pregnancy test, result negative: Secondary | ICD-10-CM | POA: Insufficient documentation

## 2015-02-08 DIAGNOSIS — Z880 Allergy status to penicillin: Secondary | ICD-10-CM | POA: Insufficient documentation

## 2015-02-08 DIAGNOSIS — N730 Acute parametritis and pelvic cellulitis: Secondary | ICD-10-CM | POA: Insufficient documentation

## 2015-02-08 DIAGNOSIS — R011 Cardiac murmur, unspecified: Secondary | ICD-10-CM | POA: Insufficient documentation

## 2015-02-08 DIAGNOSIS — R102 Pelvic and perineal pain: Secondary | ICD-10-CM

## 2015-02-08 DIAGNOSIS — Z862 Personal history of diseases of the blood and blood-forming organs and certain disorders involving the immune mechanism: Secondary | ICD-10-CM | POA: Insufficient documentation

## 2015-02-08 DIAGNOSIS — Z79899 Other long term (current) drug therapy: Secondary | ICD-10-CM | POA: Insufficient documentation

## 2015-02-08 DIAGNOSIS — R630 Anorexia: Secondary | ICD-10-CM | POA: Insufficient documentation

## 2015-02-08 DIAGNOSIS — Z8619 Personal history of other infectious and parasitic diseases: Secondary | ICD-10-CM | POA: Insufficient documentation

## 2015-02-08 LAB — URINE MICROSCOPIC-ADD ON: RBC / HPF: NONE SEEN RBC/hpf (ref 0–5)

## 2015-02-08 LAB — COMPREHENSIVE METABOLIC PANEL
ALBUMIN: 3.2 g/dL — AB (ref 3.5–5.0)
ALT: 17 U/L (ref 14–54)
ANION GAP: 9 (ref 5–15)
AST: 18 U/L (ref 15–41)
Alkaline Phosphatase: 66 U/L (ref 38–126)
BILIRUBIN TOTAL: 0.7 mg/dL (ref 0.3–1.2)
BUN: 5 mg/dL — AB (ref 6–20)
CHLORIDE: 103 mmol/L (ref 101–111)
CO2: 24 mmol/L (ref 22–32)
Calcium: 8.8 mg/dL — ABNORMAL LOW (ref 8.9–10.3)
Creatinine, Ser: 0.74 mg/dL (ref 0.44–1.00)
GFR calc Af Amer: >60 mL/min (ref >60)
GFR calc non Af Amer: >60 mL/min (ref >60)
GLUCOSE: 98 mg/dL (ref 65–99)
POTASSIUM: 3.2 mmol/L — AB (ref 3.5–5.1)
SODIUM: 136 mmol/L (ref 135–145)
TOTAL PROTEIN: 8.1 g/dL (ref 6.5–8.1)

## 2015-02-08 LAB — URINALYSIS, ROUTINE W REFLEX MICROSCOPIC
GLUCOSE, UA: NEGATIVE mg/dL
Hgb urine dipstick: NEGATIVE
Ketones, ur: 15 mg/dL — AB
LEUKOCYTES UA: NEGATIVE
NITRITE: NEGATIVE
PH: 7 (ref 5.0–8.0)
PROTEIN: 30 mg/dL — AB
SPECIFIC GRAVITY, URINE: 1.029 (ref 1.005–1.030)

## 2015-02-08 LAB — LIPASE, BLOOD: LIPASE: 35 U/L (ref 11–51)

## 2015-02-08 LAB — CBC
HEMATOCRIT: 31.7 % — AB (ref 36.0–46.0)
HEMOGLOBIN: 9.6 g/dL — AB (ref 12.0–15.0)
MCH: 23.9 pg — ABNORMAL LOW (ref 26.0–34.0)
MCHC: 30.3 g/dL (ref 30.0–36.0)
MCV: 79.1 fL (ref 78.0–100.0)
Platelets: 395 10*3/uL (ref 150–400)
RBC: 4.01 MIL/uL (ref 3.87–5.11)
RDW: 12.9 % (ref 11.5–15.5)
WBC: 12.1 10*3/uL — AB (ref 4.0–10.5)

## 2015-02-08 LAB — WET PREP, GENITAL
Sperm: NONE SEEN
TRICH WET PREP: NONE SEEN
YEAST WET PREP: NONE SEEN

## 2015-02-08 LAB — I-STAT BETA HCG BLOOD, ED (MC, WL, AP ONLY): I-stat hCG, quantitative: 9.2 m[IU]/mL — ABNORMAL HIGH (ref <5)

## 2015-02-08 LAB — HCG, QUANTITATIVE, PREGNANCY

## 2015-02-08 MED ORDER — METRONIDAZOLE 500 MG PO TABS
500.0000 mg | ORAL_TABLET | Freq: Once | ORAL | Status: AC
  Administered 2015-02-08: 500 mg via ORAL
  Filled 2015-02-08: qty 1

## 2015-02-08 MED ORDER — DOXYCYCLINE HYCLATE 100 MG PO TABS
100.0000 mg | ORAL_TABLET | Freq: Once | ORAL | Status: AC
  Administered 2015-02-08: 100 mg via ORAL
  Filled 2015-02-08: qty 1

## 2015-02-08 MED ORDER — CEFTRIAXONE SODIUM 250 MG IJ SOLR
250.0000 mg | INTRAMUSCULAR | Status: DC
  Administered 2015-02-08: 250 mg via INTRAMUSCULAR
  Filled 2015-02-08: qty 250

## 2015-02-08 MED ORDER — HYDROCODONE-ACETAMINOPHEN 5-325 MG PO TABS
2.0000 | ORAL_TABLET | Freq: Once | ORAL | Status: AC
  Administered 2015-02-08: 2 via ORAL
  Filled 2015-02-08: qty 2

## 2015-02-08 MED ORDER — LIDOCAINE HCL (PF) 1 % IJ SOLN
INTRAMUSCULAR | Status: AC
  Administered 2015-02-08: 0.9 mL
  Filled 2015-02-08: qty 5
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
## 2015-02-08 NOTE — ED Notes (Signed)
Pelvic cart at bedside. 

## 2015-02-08 NOTE — ED Notes (Signed)
Dr Shela CommonsJ in room to discuss US results and follow up information.
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
## 2015-02-09 LAB — GC/CHLAMYDIA PROBE AMP (~~LOC~~) NOT AT ARMC
CHLAMYDIA, DNA PROBE: NEGATIVE
NEISSERIA GONORRHEA: POSITIVE — AB

## 2015-02-09 LAB — HIV ANTIBODY (ROUTINE TESTING W REFLEX): HIV SCREEN 4TH GENERATION: NONREACTIVE

## 2015-02-09 LAB — RPR: RPR: NONREACTIVE

## 2015-02-09 MED ORDER — POTASSIUM CHLORIDE CRYS ER 20 MEQ PO TBCR
40.0000 meq | EXTENDED_RELEASE_TABLET | Freq: Once | ORAL | Status: AC
  Administered 2015-02-09: 40 meq via ORAL
  Filled 2015-02-09: qty 2

## 2015-02-09 MED ORDER — HYDROCODONE-ACETAMINOPHEN 5-325 MG PO TABS: 1.0000 | ORAL_TABLET | Freq: Four times a day (QID) | ORAL | Status: DC | PRN

## 2015-02-09 MED ORDER — METRONIDAZOLE 500 MG PO TABS: 500.0000 mg | ORAL_TABLET | Freq: Two times a day (BID) | ORAL | Status: DC

## 2015-02-09 MED ORDER — DOXYCYCLINE HYCLATE 100 MG PO CAPS
100.0000 mg | ORAL_CAPSULE | Freq: Two times a day (BID) | ORAL | Status: DC
Start: 2015-02-09 — End: 2016-09-18

## 2015-02-09 NOTE — ED Notes (Signed)
Pt verbalized understanding of d/c instructions and has no further questions. Pt stable and NAD.  

## 2015-02-09 NOTE — ED Notes (Signed)
Pt verbalized understanding of d/c instructions and is to follow up with OBGYN about abnormal US. Pt stable and NAD.

## 2015-02-09 NOTE — Discharge Instructions (Signed)
Pelvic Inflammatory Disease Take Advil for mild pain or the pain medicine prescribed for bad pain. The ultrasound of your pelvis shows that you have an abnormality of your left ovary. The possibility of cancer exists. Make sure that you call the women's clinic tomorrow to schedule the next available appointment. Tell the office staff that Dr.Jerline Linzy spoke with Dr. Shawnie PonsPratt about your need for a follow-up appointment. Pelvic inflammatory disease (PID) is an infection in some or all of the female organs. PID can be in the uterus, ovaries, fallopian tubes, or the surrounding tissues that are inside the lower belly area (pelvis). PID can lead to lasting problems if it is not treated. To check for this disease, your doctor may:  Do a physical exam.  Do blood tests, urine tests, or a pregnancy test.  Look at your vaginal discharge.  Do tests to look inside the pelvis.  Test you for other infections. HOME CARE  Take over-the-counter and prescription medicines only as told by your doctor.  If you were prescribed an antibiotic medicine, take it as told by your doctor. Do not stop taking it even if you start to feel better.  Do not have sex until treatment is done or as told by your doctor.  Tell your sex partner if you have PID. Your partner may need to be treated.  Keep all follow-up visits as told by your doctor. This is important.  Your doctor may test you for infection again 3 months after you are treated. GET HELP IF:  You have more fluid (discharge) coming from your vagina or fluid that is not normal.  Your pain does not improve.  You throw up (vomit).  You have a fever.  You cannot take your medicines.  Your partner has a sexually transmitted disease (STD).  You have pain when you pee (urinate). GET HELP RIGHT AWAY IF:  You have more belly (abdominal) or lower belly pain.  You have chills.  You are not better after 72 hours.   This information is not intended to replace  advice given to you by your health care provider. Make sure you discuss any questions you have with your health care provider.   Document Released: 05/23/2008 Document Revised: 11/15/2014 Document Reviewed: 04/03/2014 Elsevier Interactive Patient Education [employer]2016 [employer]vier Inc.

## 2015-02-12 ENCOUNTER — Telehealth (HOSPITAL_COMMUNITY): Payer: Self-pay

## 2015-02-12 NOTE — Telephone Encounter (Signed)
Results received from [facility].  (+) Gonorrhea.  Treated with Rocephin and Rx for Doxycycline.  Pt ID verified x 2.  Pt informed of dx, tx rcvd appropriate, notify partner & abstain from sex x 2 weeks.  [facility] form completed and faxed.

## 2015-03-01 ENCOUNTER — Encounter: Payer: Self-pay | Admitting: Obstetrics & Gynecology

## 2015-03-01 ENCOUNTER — Ambulatory Visit (INDEPENDENT_AMBULATORY_CARE_PROVIDER_SITE_OTHER): Payer: Self-pay | Admitting: Obstetrics & Gynecology

## 2015-03-01 VITALS — BP 109/35 | HR 63 | Temp 98.2°F | Ht 65.0 in | Wt 194.2 lb

## 2015-03-01 DIAGNOSIS — R1900 Intra-abdominal and pelvic swelling, mass and lump, unspecified site: Secondary | ICD-10-CM
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
## 2015-03-01 NOTE — Patient Instructions (Signed)

## 2015-03-19 NOTE — Progress Notes (Signed)
Need to reschedule her US appt (scheduled currently for 1/23 at 2pm) due to US schedule.  Called patient on 03/19/15 at 1700 and left message with patients mother to return our call tomorrow between 8-4pm at [phone].

## 2015-04-02 ENCOUNTER — Ambulatory Visit (HOSPITAL_COMMUNITY)
Admission: RE | Admit: 2015-04-02 | Discharge: 2015-04-02 | Disposition: A | Discharge status: Home / Self Care | Payer: Self-pay | Source: Ambulatory Visit | Attending: Obstetrics & Gynecology | Admitting: Obstetrics & Gynecology

## 2015-04-02 ENCOUNTER — Ambulatory Visit (HOSPITAL_COMMUNITY): Payer: Self-pay
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
## 2015-10-07 IMAGING — CR DG ABD PORTABLE 1V
1 series · 1 of 1 positions shown · non-contrast
Comparison: None.

CLINICAL DATA: Orogastric tube placement

EXAM:
PORTABLE ABDOMEN - 1 VIEW

[AP]
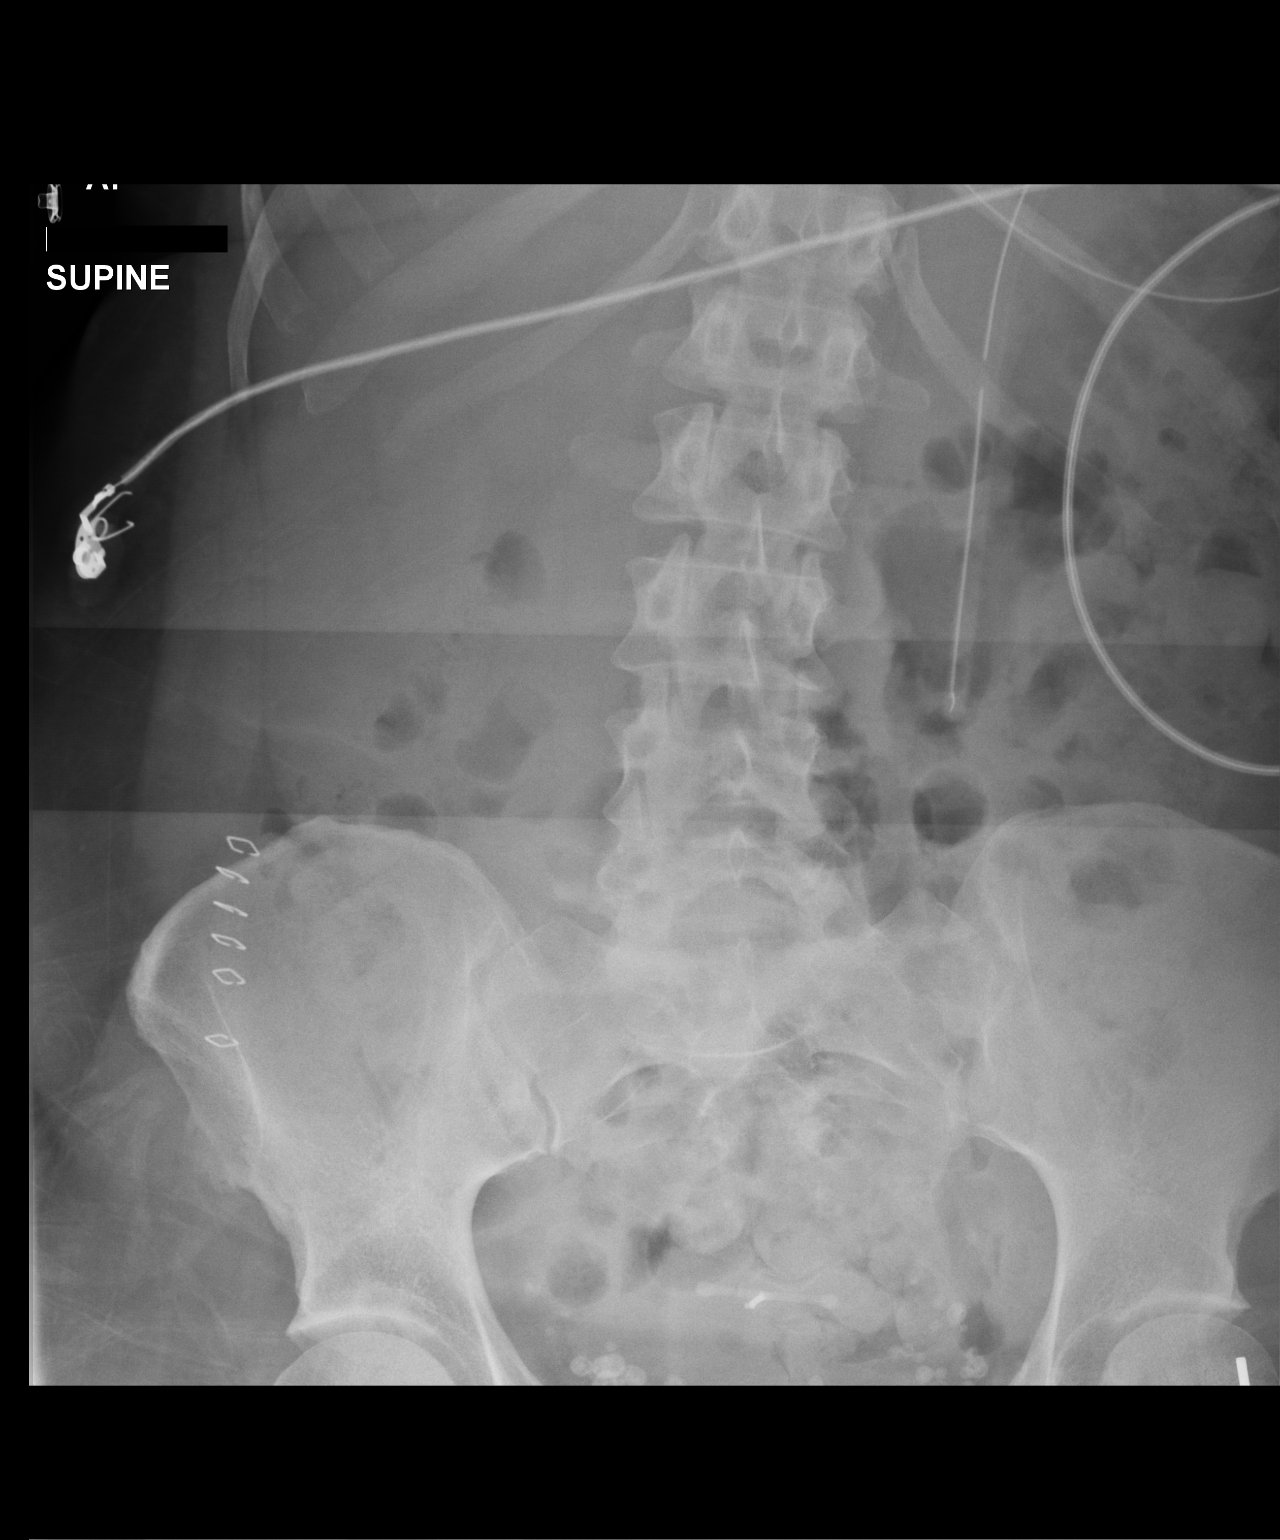

[1 of 1 positions shown; findings below may reference images not displayed]

FINDINGS: Orogastric tube tip and side port in stomach. Bowel gas pattern is
unremarkable. No obstruction or free air is seen on this supine
examination. There is an intrauterine device in the mid-pelvis.
There are phleboliths in the pelvis. There are skin staples over the
right iliac crest.
IMPRESSION: Orogastric tube tip and side port in stomach. Bowel gas pattern
unremarkable. Intrauterine device in mid pelvis.

## 2016-09-18 ENCOUNTER — Encounter (INDEPENDENT_AMBULATORY_CARE_PROVIDER_SITE_OTHER): Payer: Self-pay | Admitting: Physician Assistant

## 2016-09-18 ENCOUNTER — Ambulatory Visit (HOSPITAL_COMMUNITY)
Admission: RE | Admit: 2016-09-18 | Discharge: 2016-09-18 | Disposition: A | Discharge status: Home / Self Care | Payer: Medicaid Other | Source: Ambulatory Visit | Attending: Physician Assistant | Admitting: Physician Assistant

## 2016-09-18 ENCOUNTER — Ambulatory Visit (INDEPENDENT_AMBULATORY_CARE_PROVIDER_SITE_OTHER): Payer: Self-pay | Admitting: Physician Assistant

## 2016-09-18 VITALS — BP 103/68 | HR 59 | Temp 98.3°F | Ht 64.0 in | Wt 207.0 lb

## 2016-09-18 DIAGNOSIS — D649 Anemia, unspecified: Secondary | ICD-10-CM

## 2016-09-18 DIAGNOSIS — R600 Localized edema: Secondary | ICD-10-CM

## 2016-09-18 DIAGNOSIS — M79602 Pain in left arm: Secondary | ICD-10-CM

## 2016-09-18 DIAGNOSIS — M545 Low back pain, unspecified: Secondary | ICD-10-CM

## 2016-09-18 DIAGNOSIS — M2578 Osteophyte, vertebrae: Secondary | ICD-10-CM | POA: Insufficient documentation

## 2016-09-18 DIAGNOSIS — R202 Paresthesia of skin: Secondary | ICD-10-CM

## 2016-09-18 DIAGNOSIS — M79601 Pain in right arm: Secondary | ICD-10-CM

## 2016-09-18 DIAGNOSIS — G8929 Other chronic pain: Secondary | ICD-10-CM

## 2016-09-18 DIAGNOSIS — M47814 Spondylosis without myelopathy or radiculopathy, thoracic region: Secondary | ICD-10-CM | POA: Insufficient documentation

## 2016-09-18 DIAGNOSIS — I878 Other specified disorders of veins: Secondary | ICD-10-CM | POA: Insufficient documentation

## 2016-09-18 DIAGNOSIS — E538 Deficiency of other specified B group vitamins: Secondary | ICD-10-CM

## 2016-09-18 LAB — POCT URINE PREGNANCY: PREG TEST UR: NEGATIVE

## 2016-09-18 LAB — POCT URINALYSIS DIPSTICK
Glucose, UA: NEGATIVE
LEUKOCYTES UA: NEGATIVE
NITRITE UA: NEGATIVE
PH UA: 5.5 (ref 5.0–8.0)
PROTEIN UA: 30
RBC UA: NEGATIVE
Spec Grav, UA: >=1.03 — AB (ref 1.010–1.025)
Urobilinogen, UA: 0.2 E.U./dL

## 2016-09-18 MED ORDER — NAPROXEN 500 MG PO TABS: 500.0000 mg | ORAL_TABLET | Freq: Two times a day (BID) | ORAL | 0 refills | Status: DC

## 2016-09-18 MED ORDER — CYCLOBENZAPRINE HCL 10 MG PO TABS: 10.0000 mg | ORAL_TABLET | Freq: Three times a day (TID) | ORAL | 0 refills | Status: DC | PRN

## 2016-09-18 MED ORDER — GABAPENTIN 300 MG PO CAPS: 600.0000 mg | ORAL_CAPSULE | Freq: Three times a day (TID) | ORAL | 3 refills | Status: DC

## 2016-09-18 NOTE — Progress Notes (Signed)
Subjective:  Patient ID: Paige Novak, female    DOB: 07/20/1980  Age: 36 y.o. MRN: [id number]  CC:  New patient  HPI Chiyeko A Barrilleaux is a 36 y.o. female with a PMH of anemia, vit B12 deficiency, obesity, and heart murmur presents with complaint of "nerve damage" Pt reports "nerve damage" on the entirety of the LUE and RUE, described as tingling, numbness, and burning. Also describes burning sensations of the pretibial region and foot bilaterally. Onset of paresthesia 2011. Used to take Gabapentin 900 mg per day with minimal to moderate effectiveness. Also used to take steroid injections into the lower back for "pinched nerves" in 2015. Current lower back pain is 9/10.  Has no radiculopathy to lower extremities.     Reports fluid retention in the feet and legs off and on since 2015. Used to take fluid pills that relieved her LE edema and her hypertension. Has not taken fluid pills for fluid retention for many months now. Denies CP, palpitations, SOB, HA, tingling, numbness, PND, orthopnea, presyncope, or syncope.    ROS Review of Systems  Constitutional: Negative for chills, fever and malaise/fatigue.  Eyes: Negative for blurred vision.  Respiratory: Negative for shortness of breath.   Cardiovascular: Negative for chest pain and palpitations.  Gastrointestinal: Negative for abdominal pain and nausea.  Genitourinary: Negative for dysuria and hematuria.  Musculoskeletal: Negative for joint pain and myalgias.  Skin: Negative for rash.  Neurological: Positive for tingling. Negative for headaches.  Psychiatric/Behavioral: Negative for depression. The patient is not nervous/anxious.     Objective:  BP 103/68   Pulse (!) 59   Temp 98.3 F (36.8 C) (Oral)   Ht 5\' 4"  (1.626 m)   Wt 207 lb (93.9 kg)   SpO2 100%   BMI 35.53 kg/m   BP/Weight 09/18/2016 03/01/2015 02/09/2015  Systolic BP 103 109 102  Diastolic BP 68 35 58  Wt. (Lbs) 207 194.2 -  BMI 35.53 32.32 -      Physical  Exam  Constitutional: She is oriented to person, place, and time.  Well developed, obese, NAD, reserved, averts gaze  HENT:  Head: Normocephalic and atraumatic.  Eyes: Conjunctivae are normal. No scleral icterus.  Neck: Normal range of motion. Neck supple. No thyromegaly present.  Cardiovascular: Regular rhythm and normal heart sounds.   2+ pitting edema of the lower extremities, 2/6 systolic murmur  Pulmonary/Chest: Effort normal and breath sounds normal. No respiratory distress.  Musculoskeletal: She exhibits no edema.  Neurological: She is alert and oriented to person, place, and time. No cranial nerve deficit. Coordination normal.  Skin: Skin is warm and dry. No rash noted. No erythema. No pallor.  Psychiatric: She has a normal mood and affect. Her behavior is normal. Thought content normal.  Vitals reviewed.    Assessment & Plan:   1. Paresthesia and pain of both upper extremities - Comprehensive metabolic panel - TSH - Vitamin B12 lab draw - Begin gabapentin (NEURONTIN) 300 MG capsule; Take 2 capsules (600 mg total) by mouth 3 (three) times daily.  Dispense: 180 capsule; Refill: 3  2. Bilateral lower extremity edema - Comprehensive metabolic panel - Brain natriuretic peptide - POCT urine pregnancy negative in clinic today - Will need to review lab results to further identify noncardiac causes.  3. Anemia, unspecified type - CBC with Differential  4. Vitamin B12 deficiency - Vitamin B12 lab draw  5. Chronic left-sided low back pain without sciatica - Begin cyclobenzaprine (FLEXERIL) 10 MG tablet; Take 1 tablet (  10 mg total) by mouth 3 (three) times daily as needed for muscle spasms.  Dispense: 30 tablet; Refill: 0 - Begin naproxen (NAPROSYN) 500 MG tablet; Take 1 tablet (500 mg total) by mouth 2 (two) times daily with a meal.  Dispense: 30 tablet; Refill: 0 - DG Lumbar Spine Complete - Urinalysis Dipstick without sign of infection   Meds ordered this encounter   Medications  . gabapentin (NEURONTIN) 300 MG capsule    Sig: Take 2 capsules (600 mg total) by mouth 3 (three) times daily.    Dispense:  180 capsule    Refill:  3    Order Specific Question:   Supervising Provider    Answer:   Quentin AngstJEGEDE, OLUGBEMIGA E [id number]]  . cyclobenzaprine (FLEXERIL) 10 MG tablet    Sig: Take 1 tablet (10 mg total) by mouth 3 (three) times daily as needed for muscle spasms.    Dispense:  30 tablet    Refill:  0    Order Specific Question:   Supervising Provider    Answer:   Quentin AngstJEGEDE, OLUGBEMIGA E [id number]  . naproxen (NAPROSYN) 500 MG tablet    Sig: Take 1 tablet (500 mg total) by mouth 2 (two) times daily with a meal.    Dispense:  30 tablet    Refill:  0    Order Specific Question:   Supervising Provider    Answer:   Quentin AngstJEGEDE, OLUGBEMIGA E [[id number]]    Follow-up: Return in about 4 weeks (around 10/16/2016).   Loletta Specteroger David Bailyn Spackman PA

## 2016-09-18 NOTE — Patient Instructions (Signed)

## 2016-09-19 ENCOUNTER — Other Ambulatory Visit (INDEPENDENT_AMBULATORY_CARE_PROVIDER_SITE_OTHER): Payer: Self-pay | Admitting: Physician Assistant

## 2016-09-19 ENCOUNTER — Ambulatory Visit (INDEPENDENT_AMBULATORY_CARE_PROVIDER_SITE_OTHER): Payer: Medicaid Other

## 2016-09-19 ENCOUNTER — Telehealth (INDEPENDENT_AMBULATORY_CARE_PROVIDER_SITE_OTHER): Payer: Self-pay | Admitting: Physician Assistant

## 2016-09-19 DIAGNOSIS — D649 Anemia, unspecified: Secondary | ICD-10-CM

## 2016-09-19 LAB — CBC WITH DIFFERENTIAL/PLATELET
BASOS ABS: 0 10*3/uL (ref 0.0–0.2)
BASOS: 0 %
EOS (ABSOLUTE): 0.2 10*3/uL (ref 0.0–0.4)
Eos: 4 %
HEMOGLOBIN: 9.6 g/dL — AB (ref 11.1–15.9)
Hematocrit: 31.5 % — ABNORMAL LOW (ref 34.0–46.6)
IMMATURE GRANS (ABS): 0 10*3/uL (ref 0.0–0.1)
IMMATURE GRANULOCYTES: 0 %
LYMPHS: 31 %
Lymphocytes Absolute: 1.5 10*3/uL (ref 0.7–3.1)
MCH: 23.8 pg — ABNORMAL LOW (ref 26.6–33.0)
MCHC: 30.5 g/dL — ABNORMAL LOW (ref 31.5–35.7)
MCV: 78 fL — ABNORMAL LOW (ref 79–97)
MONOCYTES: 5 %
Monocytes Absolute: 0.2 10*3/uL (ref 0.1–0.9)
NEUTROS ABS: 2.9 10*3/uL (ref 1.4–7.0)
NEUTROS PCT: 60 %
PLATELETS: 213 10*3/uL (ref 150–379)
RBC: 4.04 x10E6/uL (ref 3.77–5.28)
RDW: 14.2 % (ref 12.3–15.4)
WBC: 4.8 10*3/uL (ref 3.4–10.8)

## 2016-09-19 LAB — COMPREHENSIVE METABOLIC PANEL
ALBUMIN: 4.3 g/dL (ref 3.5–5.5)
ALT: 16 IU/L (ref 0–32)
AST: 29 IU/L (ref 0–40)
Albumin/Globulin Ratio: 1.3 (ref 1.2–2.2)
Alkaline Phosphatase: 54 IU/L (ref 39–117)
BUN / CREAT RATIO: 13 (ref 9–23)
BUN: 12 mg/dL (ref 6–20)
Bilirubin Total: 0.6 mg/dL (ref 0.0–1.2)
CALCIUM: 9.2 mg/dL (ref 8.7–10.2)
CO2: 20 mmol/L (ref 20–29)
CREATININE: 0.91 mg/dL (ref 0.57–1.00)
Chloride: 107 mmol/L — ABNORMAL HIGH (ref 96–106)
GFR, EST AFRICAN AMERICAN: 93 mL/min/{1.73_m2} (ref >59)
GFR, EST NON AFRICAN AMERICAN: 80 mL/min/{1.73_m2} (ref >59)
GLUCOSE: 85 mg/dL (ref 65–99)
Globulin, Total: 3.4 g/dL (ref 1.5–4.5)
Potassium: 3.8 mmol/L (ref 3.5–5.2)
Sodium: 142 mmol/L (ref 134–144)
TOTAL PROTEIN: 7.7 g/dL (ref 6.0–8.5)

## 2016-09-19 LAB — TSH: TSH: 2.73 u[IU]/mL (ref 0.450–4.500)

## 2016-09-19 LAB — VITAMIN B12: VITAMIN B 12: 387 pg/mL (ref 232–1245)

## 2016-09-19 LAB — BRAIN NATRIURETIC PEPTIDE: BNP: 7.2 pg/mL (ref 0.0–100.0)

## 2016-09-19 MED ORDER — SENNOSIDES-DOCUSATE SODIUM 8.6-50 MG PO TABS: 1.0000 | ORAL_TABLET | Freq: Two times a day (BID) | ORAL | 1 refills | Status: DC

## 2016-09-19 MED ORDER — FERROUS SULFATE 325 (65 FE) MG PO TABS: 325.0000 mg | ORAL_TABLET | Freq: Every day | ORAL | 1 refills | Status: DC

## 2016-09-19 NOTE — Telephone Encounter (Signed)
Patient called left voicemail would like to get results.  Please follow up with patient she left phone [phone]

## 2016-10-10 ENCOUNTER — Ambulatory Visit (INDEPENDENT_AMBULATORY_CARE_PROVIDER_SITE_OTHER): Payer: Medicaid Other

## 2016-10-16 ENCOUNTER — Ambulatory Visit (INDEPENDENT_AMBULATORY_CARE_PROVIDER_SITE_OTHER): Payer: Medicaid Other | Admitting: Physician Assistant

## 2016-10-24 ENCOUNTER — Ambulatory Visit (INDEPENDENT_AMBULATORY_CARE_PROVIDER_SITE_OTHER): Payer: Medicaid Other

## 2016-10-28 ENCOUNTER — Ambulatory Visit (INDEPENDENT_AMBULATORY_CARE_PROVIDER_SITE_OTHER): Payer: Medicaid Other | Admitting: Physician Assistant

## 2016-10-29 ENCOUNTER — Ambulatory Visit: Payer: Self-pay | Attending: Internal Medicine

## 2016-11-06 ENCOUNTER — Ambulatory Visit (INDEPENDENT_AMBULATORY_CARE_PROVIDER_SITE_OTHER): Payer: Medicaid Other | Admitting: Physician Assistant

## 2016-11-12 ENCOUNTER — Ambulatory Visit (INDEPENDENT_AMBULATORY_CARE_PROVIDER_SITE_OTHER): Payer: Self-pay | Admitting: Physician Assistant

## 2016-11-12 ENCOUNTER — Encounter (INDEPENDENT_AMBULATORY_CARE_PROVIDER_SITE_OTHER): Payer: Self-pay | Admitting: Physician Assistant

## 2016-11-12 VITALS — BP 96/62 | HR 58 | Temp 98.1°F | Resp 18 | Ht 64.0 in | Wt 210.0 lb

## 2016-11-12 DIAGNOSIS — L84 Corns and callosities: Secondary | ICD-10-CM

## 2016-11-12 DIAGNOSIS — M79601 Pain in right arm: Secondary | ICD-10-CM

## 2016-11-12 DIAGNOSIS — G8929 Other chronic pain: Secondary | ICD-10-CM

## 2016-11-12 DIAGNOSIS — R202 Paresthesia of skin: Secondary | ICD-10-CM

## 2016-11-12 DIAGNOSIS — M545 Low back pain: Secondary | ICD-10-CM

## 2016-11-12 DIAGNOSIS — D649 Anemia, unspecified: Secondary | ICD-10-CM

## 2016-11-12 DIAGNOSIS — M79602 Pain in left arm: Secondary | ICD-10-CM

## 2016-11-12 MED ORDER — GABAPENTIN 300 MG PO CAPS: 900.0000 mg | ORAL_CAPSULE | Freq: Three times a day (TID) | ORAL | 2 refills | Status: DC

## 2016-11-12 NOTE — Progress Notes (Signed)
Subjective:  Patient ID: Paige Novak, female    DOB: February 02, 1981  Age: 36 y.o. MRN: [id number]  CC: f/u   HPI  Paige Novak is a 36 y.o. female with a PMH of anemia, vit B12 deficiency, obesity, and heart murmur presents on f/u of paresthesia and pain of both the upper extremities. Was prescribed Gabapentin 600 mg TID and feels approximately 50% better. 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 Lower back pain continues and is also attributed to her job duties. Does not know if the cyclobenzaprine was helpful or not. Does not endorse any other symptoms or complaints.     Outpatient Medications Prior to Visit  Medication Sig Dispense Refill  . acetaminophen (TYLENOL) 500 MG tablet Take 1,000 mg by mouth daily as needed for moderate pain. Reported on 03/01/2015    . cyclobenzaprine (FLEXERIL) 10 MG tablet Take 1 tablet (10 mg total) by mouth 3 (three) times daily as needed for muscle spasms. 30 tablet 0  . ferrous sulfate 325 (65 FE) MG tablet Take 1 tablet (325 mg total) by mouth daily with breakfast. 90 tablet 1  . gabapentin (NEURONTIN) 300 MG capsule Take 2 capsules (600 mg total) by mouth 3 (three) times daily. 180 capsule 3  . naproxen (NAPROSYN) 500 MG tablet Take 1 tablet (500 mg total) by mouth 2 (two) times daily with a meal. 30 tablet 0  . senna-docusate (SENOKOT-S) 8.6-50 MG tablet Take 1 tablet by mouth 2 (two) times daily. 60 tablet 1   No facility-administered medications prior to visit.      ROS Review of Systems  Constitutional: Negative for chills, fever and malaise/fatigue.  Eyes: Negative for blurred vision.  Respiratory: Negative for shortness of breath.   Cardiovascular: Negative for chest pain and palpitations.  Gastrointestinal: Negative for abdominal pain and nausea.  Genitourinary:  Negative for dysuria and hematuria.  Musculoskeletal: Positive for back pain. Negative for joint pain and myalgias.  Skin: Negative for rash.       callus  Neurological: Positive for tingling. Negative for headaches.  Psychiatric/Behavioral: Negative for depression. The patient is not nervous/anxious.     Objective:  BP 96/62 (BP Location: Left Arm, Patient Position: Sitting, Cuff Size: Large)   Pulse (!) 58   Temp 98.1 F (36.7 C) (Oral)   Resp 18   Ht 5\' 4"  (1.626 m)   Wt 210 lb (95.3 kg)   SpO2 100%   BMI 36.05 kg/m   BP/Weight 11/12/2016 09/18/2016 03/01/2015  Systolic BP 96 103 109  Diastolic BP 62 68 35  Wt. (Lbs) 210 207 194.2  BMI 36.05 35.53 32.32      Physical Exam  Constitutional: She is oriented to person, place, and time.  Well developed, obese, NAD, polite  HENT:  Head: Normocephalic and atraumatic.  Eyes: Conjunctivae are normal. No scleral icterus.  Neck: Normal range of motion. Neck supple. No thyromegaly present.  Cardiovascular: Normal rate, regular rhythm and normal heart sounds.   No LE edema bilaterally  Pulmonary/Chest: Effort normal and breath sounds normal.  Musculoskeletal: She exhibits no edema.  Neurological: She is alert and oriented to person, place, and time. No cranial nerve deficit. Coordination normal.  Skin: Skin is warm and dry. No rash noted. No erythema. No pallor.  Large callus on the lateral aspect of right sole.  Psychiatric:  She has a normal mood and affect. Her behavior is normal. Thought content normal.  Vitals reviewed.    Assessment & Plan:    1. Paresthesia and pain of both upper extremities - Ambulatory referral to Neurology - Increase gabapentin (NEURONTIN) 300 MG capsule; Take 3 capsules (900 mg total) by mouth 3 (three) times daily.  Dispense: 270 capsule; Refill: 2  2. Chronic left-sided low back pain without sciatica - Ambulatory referral to Physical Therapy  3. Callus of foot - Shaved down large callus on  right sole. No complications. Patient immediately felt relief of pain upon standing.  - Ambulatory referral to Podiatry  4. Anemia, unspecified type - CBC with Differential   Meds ordered this encounter  Medications  . gabapentin (NEURONTIN) 300 MG capsule    Sig: Take 3 capsules (900 mg total) by mouth 3 (three) times daily.    Dispense:  270 capsule    Refill:  2    Order Specific Question:   Supervising Provider    Answer:   Quentin Angst [id number]    Follow-up: Return in about 4 weeks (around 12/10/2016) for paresthesia.   Loletta Specter PA

## 2016-11-12 NOTE — Patient Instructions (Signed)

## 2016-12-12 ENCOUNTER — Ambulatory Visit (INDEPENDENT_AMBULATORY_CARE_PROVIDER_SITE_OTHER): Payer: Self-pay | Admitting: Physician Assistant

## 2017-01-28 IMAGING — US US PELVIS COMPLETE
1 series · 13 of 25 positions shown · non-contrast
Comparison: None.

CLINICAL DATA: Pelvic tenderness, discharge for 1 month

EXAM:
TRANSABDOMINAL AND TRANSVAGINAL ULTRASOUND OF PELVIS
DOPPLER ULTRASOUND OF OVARIES
TECHNIQUE: Both transabdominal and transvaginal ultrasound examinations of the
pelvis were performed. Transabdominal technique was performed for
global imaging of the pelvis including uterus, ovaries, adnexal
regions, and pelvic cul-de-sac.
It was necessary to proceed with endovaginal exam following the
transabdominal exam to visualize the endometrium and ovaries. Color
and duplex Doppler ultrasound was utilized to evaluate blood flow to
the ovaries.

[Series 1: us pelvis complete · 0.24mm/px · 13 of 80 slices shown]
[im 1/80]
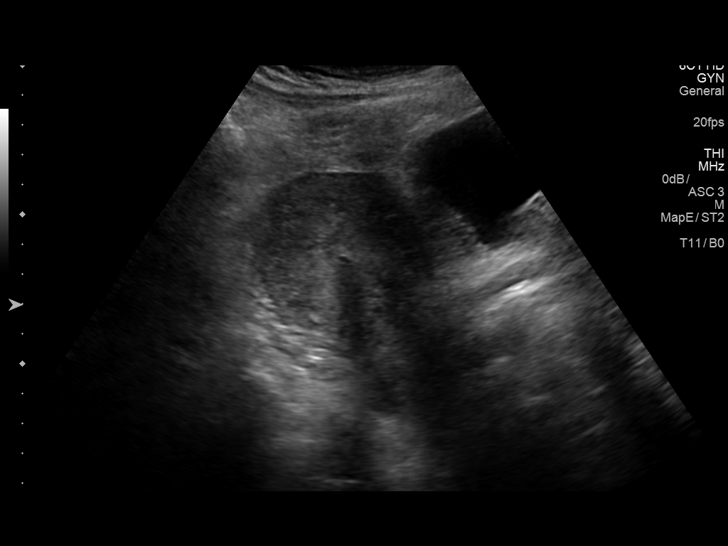
[im 7/80]
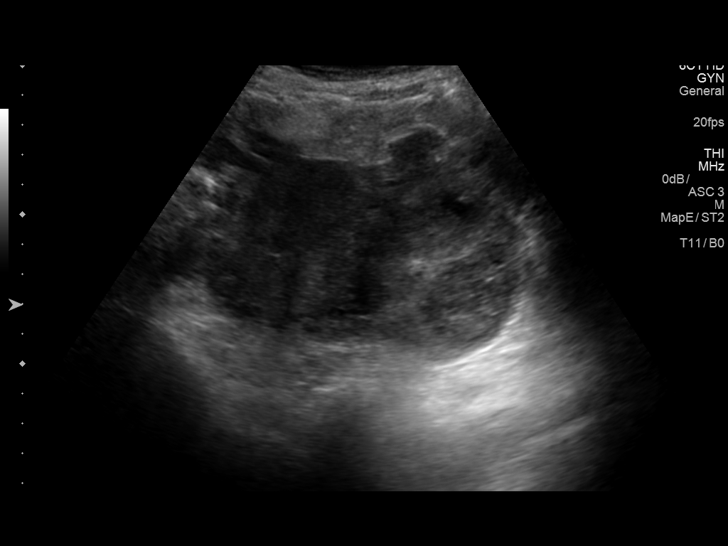
[im 14/80]
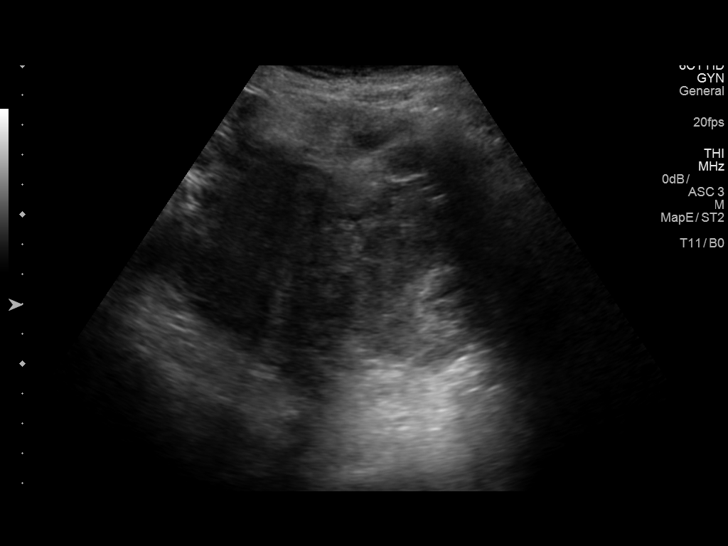
[im 20/80]
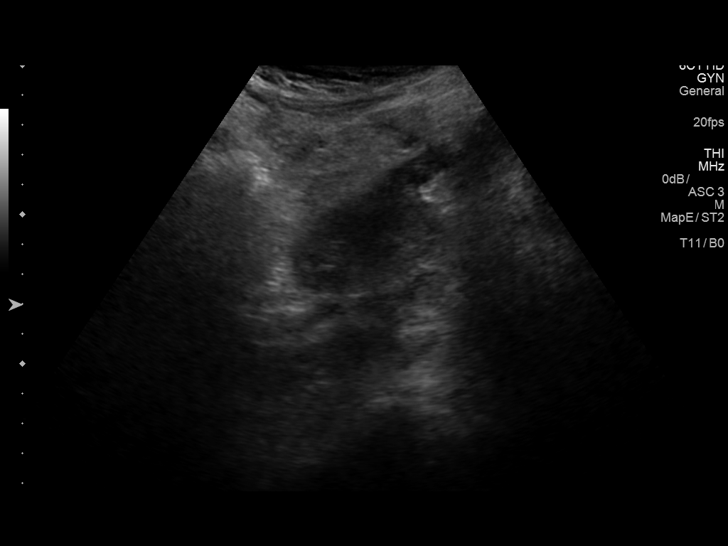
[im 27/80]
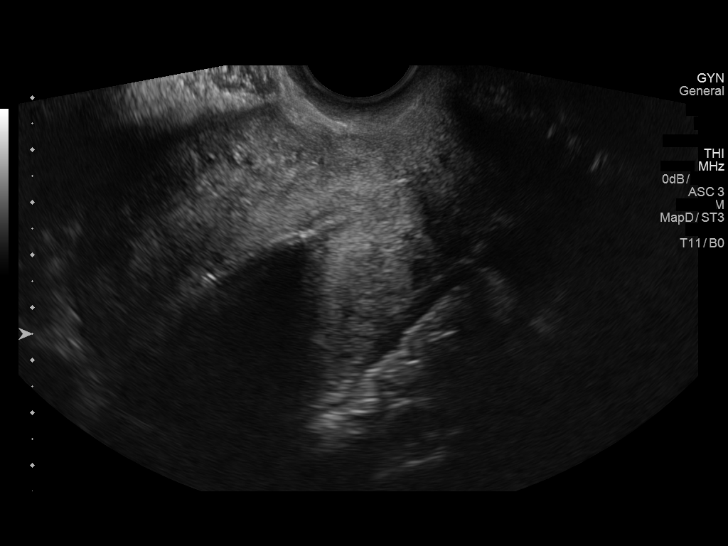
[im 33/80]
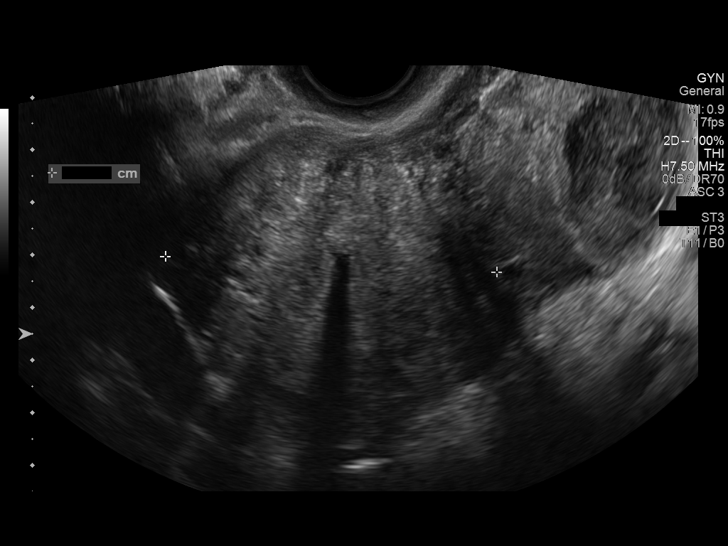
[im 40/80]
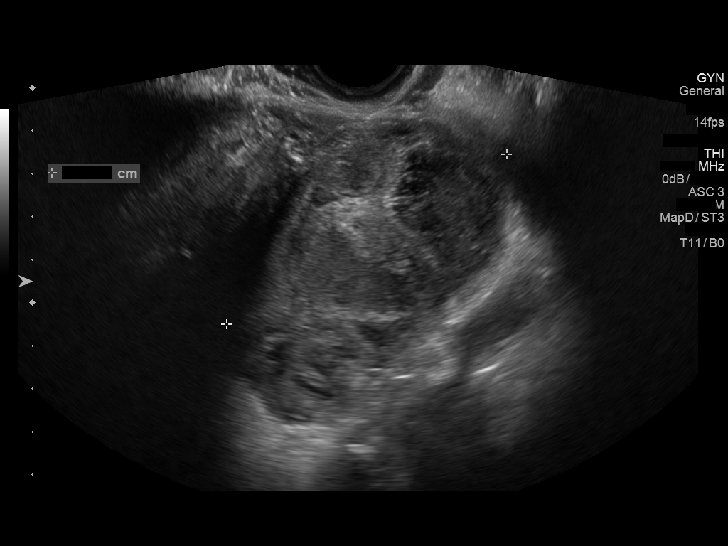
[im 47/80]
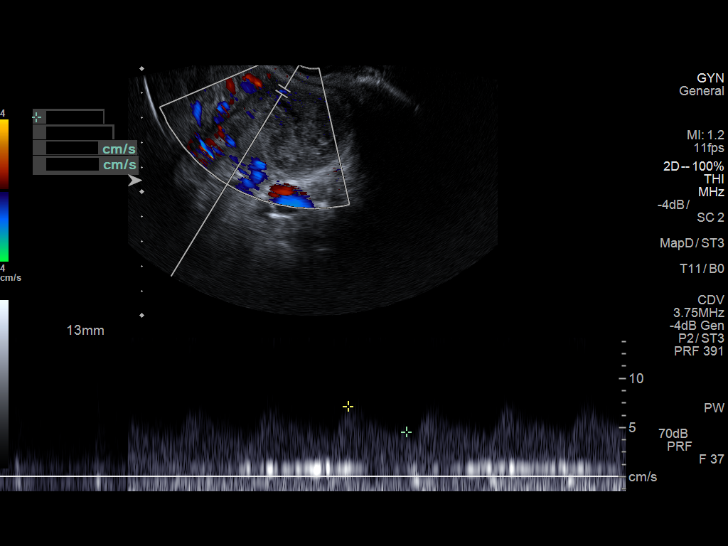
[im 53/80]
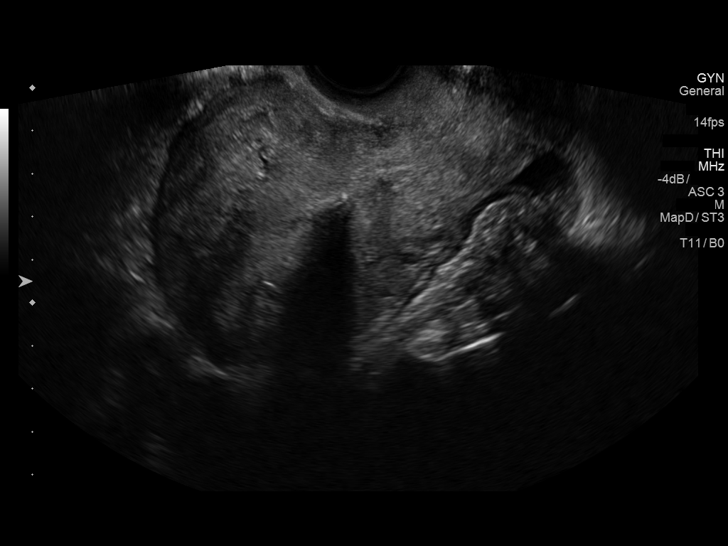
[im 60/80]
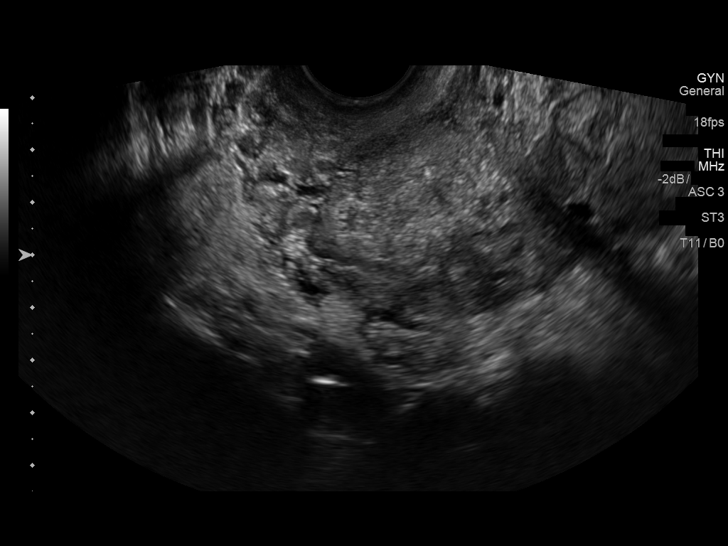
[im 66/80]
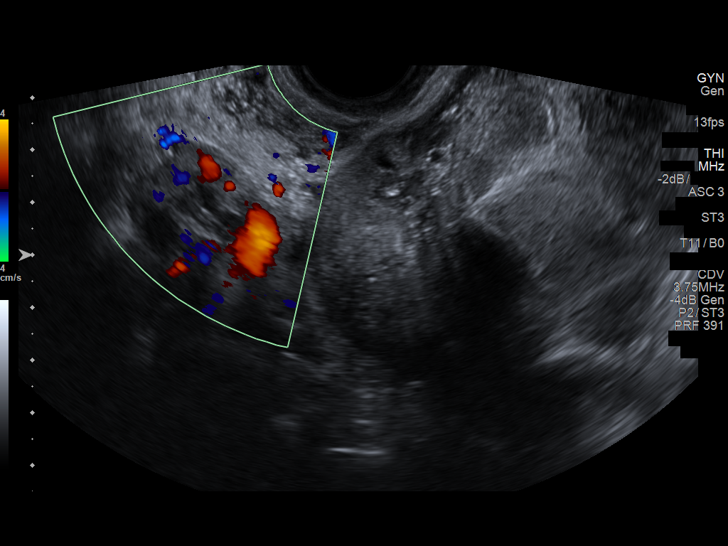
[im 73/80]
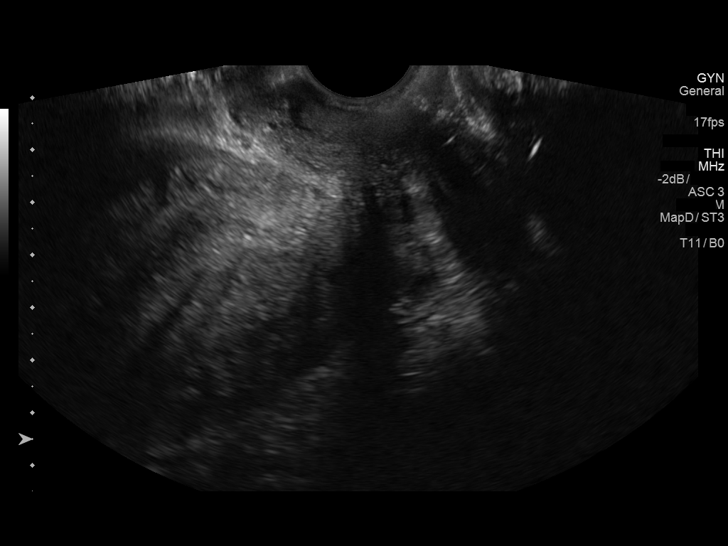
[im 80/80]
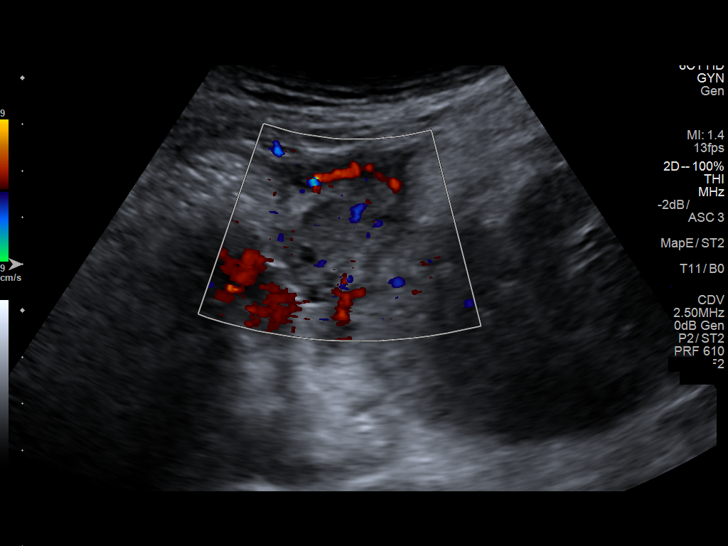

[13 of 25 positions shown; findings below may reference images not displayed]

FINDINGS: Uterus

Measurements: 8.3 x 5.4 x 5.7 cm. No fibroids or other mass
visualized. Intrauterine device in satisfactory position.

Endometrium

Thickness: 6.3 mm.  No focal abnormality visualized.

Right ovary

Measurements: 2.2 x 1.5 x 2.3 cm. Normal appearance/no adnexal mass.

Left ovary

Measurements: 7.5 x 6.8 x 7.6 cm. The entire ovary appears more
masslike with loss of the normal ovarian echotexture. Left adnexal
tubular anechoic structure may represent a hydrosalpinx.

Pulsed Doppler evaluation of both ovaries demonstrates normal
low-resistance arterial and venous waveforms.

Other findings

Trace pelvic free fluid, likely physiologic.
IMPRESSION: 1. Overall, enlarged nodular left ovary measuring 7.5 x 6.8 x 7.6 cm
with a more focal 2.8 x 2.8 cm complex cystic left ovarian mass. The
entire left ovary appears abnormal in appearance. The appearance is
concerning for neoplasm. Recommend further evaluation with MRI of
the pelvis without and with intravenous contrast.

## 2017-02-20 ENCOUNTER — Ambulatory Visit (INDEPENDENT_AMBULATORY_CARE_PROVIDER_SITE_OTHER): Payer: No Typology Code available for payment source | Admitting: Podiatry

## 2017-02-20 ENCOUNTER — Encounter: Payer: Self-pay | Admitting: Podiatry

## 2017-02-20 DIAGNOSIS — M779 Enthesopathy, unspecified: Secondary | ICD-10-CM

## 2017-02-20 NOTE — Progress Notes (Signed)
   Subjective:    Patient ID: Paige Novak, female    DOB: 1980-10-17, 36 y.o.   MRN: [id number]  HPI    Review of Systems  All other systems reviewed and are negative.      Objective:   Physical Exam        Assessment & Plan:

## 2017-02-21 NOTE — Progress Notes (Signed)
Subjective:   Patient ID: Paige Novak, female   DOB: 36 y.o.   MRN: [id number]   HPI Patient presents with nerve pain most likely related to the biopsy and several nucleated calluses right foot   ROS      Objective:  Physical Exam  Neurovascular status was intact with patient found to have deep keratotic lesions plantar right and indications of neuropathic changes     Assessment:  I explained she will have to continue to pursue with a neurosurgeon her back issues and that may help her legs some but at this time I do think lesions are the problem     Plan:  Reviewed the lesions present and debrided lesions with no iatrogenic bleeding and patient will be seen back as needed

## 2017-06-25 ENCOUNTER — Ambulatory Visit (INDEPENDENT_AMBULATORY_CARE_PROVIDER_SITE_OTHER): Payer: Self-pay | Admitting: Nurse Practitioner

## 2017-06-25 ENCOUNTER — Other Ambulatory Visit: Payer: Self-pay

## 2017-06-25 ENCOUNTER — Encounter (INDEPENDENT_AMBULATORY_CARE_PROVIDER_SITE_OTHER): Payer: Self-pay | Admitting: Nurse Practitioner

## 2017-06-25 DIAGNOSIS — D508 Other iron deficiency anemias: Secondary | ICD-10-CM

## 2017-06-25 DIAGNOSIS — G4710 Hypersomnia, unspecified: Secondary | ICD-10-CM

## 2017-06-25 NOTE — Progress Notes (Signed)
Problems with staying awake. Works 3rd shift.
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
## 2017-06-25 NOTE — Patient Instructions (Addendum)
Hypersomnia Hypersomnia is when you feel extremely tired during the day even though you're getting plenty of sleep at night. You may need to take naps during the day, and you may also be extremely difficult to wake up when you are sleeping. What are the causes? The cause of your hypersomnia may not be known. Hypersomnia may be caused by:  Medicines.  Sleep disorders, such as narcolepsy.  Trauma or injury to your head or nervous system.  Using drugs or alcohol.  Tumors.  Medical conditions, such as depression or hypothyroidism.  Genetics.  What are the signs or symptoms? The main symptoms of hypersomnia include:  Feeling extremely tired throughout the day.  Being very difficult to wake up.  Sleeping for longer and longer periods.  Taking naps throughout the day.  Other symptoms may include:  Feeling: ? Restless. ? Annoyed. ? Anxious. ? Low energy.  Having difficulty: ? Remembering. ? Speaking. ? Thinking.  Losing your appetite.  Experiencing hallucinations.  How is this diagnosed? Hypersomnia may be diagnosed by:  Medical history and physical exam. This will include a sleep history.  Completing sleep logs.  Tests may also be done, such as: ? Polysomnography. ? Multiple sleep latency test (MSLT).  How is this treated? There is no cure for hypersomnia, but treatment can be very effective in helping manage the condition. Treatment may include:  Lifestyle and sleeping strategies to help cope with the condition.  Stimulant medicines.  Treating any underlying causes of hypersomnia.  Follow these instructions at home:  Take medicines only as directed by your health care provider.  Schedule short naps for when you feel sleepiest during the day. Tell your employer or teachers that you have hypersomnia. You may be able to adjust your schedule to include time for naps.  Avoid drinking alcohol or caffeinated beverages.  Do not eat a heavy meal before  bedtime. Eat at about the same times every day.  Do not drive or operate heavy machinery if you are sleepy.  Do not swim or go out on the water without a life jacket.  If possible, adjust your schedule so that you do not have to work or be active at night.  Keep all follow-up visits as directed by your health care provider. This is important. Contact a health care provider if:  You have new symptoms.  Your symptoms get worse. Get help right away if: You have serious thoughts of hurting yourself or someone else. This information is not intended to replace advice given to you by your health care provider. Make sure you discuss any questions you have with your health care provider. Document Released: 02/14/2002 Document Revised: 08/02/2015 Document Reviewed: 09/29/2013 Elsevier Interactive Patient Education  2018 [employer].  Screening for Sleep Apnea Sleep apnea is a condition in which breathing pauses or becomes shallow during sleep. Sleep apnea screening is a test to determine if you are at risk for sleep apnea. The test is easy and only takes a few minutes. Your health care provider may ask you to have this test in preparation for surgery or as part of a physical exam. What are the symptoms of sleep apnea? Common symptoms of sleep apnea include:  Snoring.  Restless sleep.  Daytime sleepiness.  Pauses in breathing.  Choking during sleep.  Irritability.  Forgetfulness.  Trouble thinking clearly.  Depression.  Personality changes.  Most people with sleep apnea are not aware that they have it. Why should I get screened? Getting screened for sleep  apnea can help:  Ensure your safety. It is important for your health care providers to know whether or not you have sleep apnea, especially if you are having surgery or have other long-term (chronic) health conditions.  Improve your health and allow you to get a better night's rest. Restful sleep can help you: ? Have more  energy. ? Lose weight. ? Improve high blood pressure. ? Improve diabetes management. ? Prevent stroke. ? Prevent car accidents.  How is screening done? Screening usually includes being asked a list of questions about your sleep quality. Some questions you may be asked include:  Do you snore?  Is your sleep restless?  Do you have daytime sleepiness?  Has a partner or spouse told you that you stop breathing during sleep?  Have you had trouble concentrating or memory loss?  If your screening test is positive, you are at risk for the condition. Further testing may be needed to confirm a diagnosis of sleep apnea. Where to find more information: You can find screening tools online or at your health care clinic. For more information about sleep apnea screening and healthy sleep, visit these websites:  Centers for Disease Control and Prevention: [employer].iswww.cdc.gov/sleep/index.html  American Sleep Apnea Association: www.sleepapnea.org  Contact a health care provider if:  You think that you may have sleep apnea. Summary  Sleep apnea screening can help determine if you are at risk for sleep apnea.  It is important for your health care providers to know whether or not you have sleep apnea, especially if you are having surgery or have other chronic health conditions.  You may be asked to take a screening test for sleep apnea in preparation for surgery or as part of a physical exam. This information is not intended to replace advice given to you by your health care provider. Make sure you discuss any questions you have with your health care provider. Document Released: 06/06/2016 Document Revised: 06/06/2016 Document Reviewed: 06/06/2016 Elsevier Interactive Patient Education  2018 [employer]

## 2017-06-26 LAB — CBC
HEMOGLOBIN: 10.3 g/dL — AB (ref 11.1–15.9)
Hematocrit: 32.7 % — ABNORMAL LOW (ref 34.0–46.6)
MCH: 24.5 pg — AB (ref 26.6–33.0)
MCHC: 31.5 g/dL (ref 31.5–35.7)
MCV: 78 fL — ABNORMAL LOW (ref 79–97)
Platelets: 228 10*3/uL (ref 150–379)
RBC: 4.21 x10E6/uL (ref 3.77–5.28)
RDW: 14.7 % (ref 12.3–15.4)
WBC: 4.6 10*3/uL (ref 3.4–10.8)

## 2017-07-07 ENCOUNTER — Telehealth (INDEPENDENT_AMBULATORY_CARE_PROVIDER_SITE_OTHER): Payer: Self-pay

## 2017-07-07 NOTE — Telephone Encounter (Signed)
Patient is aware that anemia has improved and to continue taking iron tablets as prescribed and if she can not tolerate the tablets to get a liquid iron. Patient expressed understanding. Paige Novak, CMA

## 2017-07-07 NOTE — Telephone Encounter (Signed)
-----   Message from Claiborne Rigg, NP sent at 07/06/2017  9:58 PM EDT ----- Anemia has improved. Please continue to take iron tablets as prescribed. If you are unable to tolerate the tablets I would suggest liquid iron.

## 2017-10-02 ENCOUNTER — Ambulatory Visit: Payer: Medicaid Other

## 2017-10-09 ENCOUNTER — Ambulatory Visit: Payer: Medicaid Other | Attending: Physician Assistant

## 2018-12-01 ENCOUNTER — Other Ambulatory Visit: Payer: Self-pay

## 2018-12-01 ENCOUNTER — Encounter (INDEPENDENT_AMBULATORY_CARE_PROVIDER_SITE_OTHER): Payer: Self-pay | Admitting: Primary Care

## 2018-12-01 ENCOUNTER — Ambulatory Visit (INDEPENDENT_AMBULATORY_CARE_PROVIDER_SITE_OTHER): Payer: Medicaid Other | Admitting: Primary Care

## 2018-12-01 VITALS — BP 111/64 | HR 57 | Temp 97.5°F | Ht 64.0 in | Wt 245.2 lb

## 2018-12-01 DIAGNOSIS — Z0000 Encounter for general adult medical examination without abnormal findings: Secondary | ICD-10-CM

## 2018-12-01 DIAGNOSIS — D508 Other iron deficiency anemias: Secondary | ICD-10-CM

## 2018-12-01 DIAGNOSIS — M792 Neuralgia and neuritis, unspecified: Secondary | ICD-10-CM

## 2018-12-01 DIAGNOSIS — H0520 Unspecified exophthalmos: Secondary | ICD-10-CM

## 2018-12-01 DIAGNOSIS — Z6841 Body Mass Index (BMI) 40.0 and over, adult: Secondary | ICD-10-CM | POA: Diagnosis not present

## 2018-12-01 DIAGNOSIS — Z0001 Encounter for general adult medical examination with abnormal findings: Secondary | ICD-10-CM | POA: Diagnosis not present

## 2018-12-01 MED ORDER — GABAPENTIN 300 MG PO CAPS: 100.0000 mg | ORAL_CAPSULE | Freq: Three times a day (TID) | ORAL | 3 refills | Status: AC
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
## 2018-12-01 NOTE — Progress Notes (Signed)
Edema in feet and legs  Left wrist pain  Right ankle pain

## 2018-12-01 NOTE — Patient Instructions (Signed)
Edema  Edema is when you have too much fluid in your body or under your skin. Edema may make your legs, feet, and ankles swell up. Swelling is also common in looser tissues, like around your eyes. This is a common condition. It gets more common as you get older. There are many possible causes of edema. Eating too much salt (sodium) and being on your feet or sitting for a long time can cause edema in your legs, feet, and ankles. Hot weather may make edema worse. Edema is usually painless. Your skin may look swollen or shiny. Follow these instructions at home:  Keep the swollen body part raised (elevated) above the level of your heart when you are sitting or lying down.  Do not sit still or stand for a long time.  Do not wear tight clothes. Do not wear garters on your upper legs.  Exercise your legs. This can help the swelling go down.  Wear elastic bandages or support stockings as told by your doctor.  Eat a low-salt (low-sodium) diet to reduce fluid as told by your doctor.  Depending on the cause of your swelling, you may need to limit how much fluid you drink (fluid restriction).  Take over-the-counter and prescription medicines only as told by your doctor. Contact a doctor if:  Treatment is not working.  You have heart, liver, or kidney disease and have symptoms of edema.  You have sudden and unexplained weight gain. Get help right away if:  You have shortness of breath or chest pain.  You cannot breathe when you lie down.  You have pain, redness, or warmth in the swollen areas.  You have heart, liver, or kidney disease and get edema all of a sudden.  You have a fever and your symptoms get worse all of a sudden. Summary  Edema is when you have too much fluid in your body or under your skin.  Edema may make your legs, feet, and ankles swell up. Swelling is also common in looser tissues, like around your eyes.  Raise (elevate) the swollen body part above the level of your  heart when you are sitting or lying down.  Follow your doctor's instructions about diet and how much fluid you can drink (fluid restriction). This information is not intended to replace advice given to you by your health care provider. Make sure you discuss any questions you have with your health care provider. Document Released: 08/13/2007 Document Revised: 02/27/2017 Document Reviewed: 03/14/2016 Elsevier Patient Education  2020 [employer] Inc[employer]  

## 2018-12-02 LAB — CBC WITH DIFFERENTIAL/PLATELET
Basophils Absolute: 0 10*3/uL (ref 0.0–0.2)
Basos: 1 %
EOS (ABSOLUTE): 0.2 10*3/uL (ref 0.0–0.4)
Eos: 3 %
Hematocrit: 30.7 % — ABNORMAL LOW (ref 34.0–46.6)
Hemoglobin: 9.8 g/dL — ABNORMAL LOW (ref 11.1–15.9)
Immature Grans (Abs): 0 10*3/uL (ref 0.0–0.1)
Immature Granulocytes: 0 %
Lymphocytes Absolute: 2.1 10*3/uL (ref 0.7–3.1)
Lymphs: 32 %
MCH: 24.7 pg — ABNORMAL LOW (ref 26.6–33.0)
MCHC: 31.9 g/dL (ref 31.5–35.7)
MCV: 78 fL — ABNORMAL LOW (ref 79–97)
Monocytes Absolute: 0.4 10*3/uL (ref 0.1–0.9)
Monocytes: 5 %
Neutrophils Absolute: 3.8 10*3/uL (ref 1.4–7.0)
Neutrophils: 59 %
Platelets: 213 10*3/uL (ref 150–450)
RBC: 3.96 x10E6/uL (ref 3.77–5.28)
RDW: 13.5 % (ref 11.7–15.4)
WBC: 6.4 10*3/uL (ref 3.4–10.8)

## 2018-12-02 LAB — COMPREHENSIVE METABOLIC PANEL
ALT: 16 IU/L (ref 0–32)
AST: 21 IU/L (ref 0–40)
Albumin/Globulin Ratio: 1.3 (ref 1.2–2.2)
Albumin: 3.9 g/dL (ref 3.8–4.8)
Alkaline Phosphatase: 69 IU/L (ref 39–117)
BUN/Creatinine Ratio: 14 (ref 9–23)
BUN: 11 mg/dL (ref 6–24)
Bilirubin Total: 0.2 mg/dL (ref 0.0–1.2)
CO2: 22 mmol/L (ref 20–29)
Calcium: 9 mg/dL (ref 8.7–10.2)
Chloride: 104 mmol/L (ref 96–106)
Creatinine, Ser: 0.81 mg/dL (ref 0.57–1.00)
GFR calc Af Amer: 105 mL/min/{1.73_m2} (ref >59)
GFR calc non Af Amer: 91 mL/min/{1.73_m2} (ref >59)
Globulin, Total: 3.1 g/dL (ref 1.5–4.5)
Glucose: 92 mg/dL (ref 65–99)
Potassium: 4 mmol/L (ref 3.5–5.2)
Sodium: 137 mmol/L (ref 134–144)
Total Protein: 7 g/dL (ref 6.0–8.5)

## 2018-12-02 LAB — LIPID PANEL
Chol/HDL Ratio: 3.3 ratio (ref 0.0–4.4)
Cholesterol, Total: 169 mg/dL (ref 100–199)
HDL: 52 mg/dL (ref >39)
LDL Chol Calc (NIH): 105 mg/dL — ABNORMAL HIGH (ref 0–99)
Triglycerides: 59 mg/dL (ref 0–149)
VLDL Cholesterol Cal: 12 mg/dL (ref 5–40)

## 2018-12-02 LAB — TSH+FREE T4
Free T4: 1.19 ng/dL (ref 0.82–1.77)
TSH: 5.92 u[IU]/mL — ABNORMAL HIGH (ref 0.450–4.500)

## 2018-12-03 ENCOUNTER — Telehealth (INDEPENDENT_AMBULATORY_CARE_PROVIDER_SITE_OTHER): Payer: Self-pay

## 2018-12-03 NOTE — Telephone Encounter (Signed)
Patient is aware that she is anemic. LDL which is bad cholesterol is elevaterd. Elevated LDL can lead to stroke and heart attack. Recommended patient to decrease the amount of fatty/fried foods, cheese, eggs and butter she consumes on a daily basis. All other labs within normal limits. Patient expressed understanding of results and did not have any questions. Nat Christen, CMA

## 2018-12-03 NOTE — Telephone Encounter (Signed)
-----   Message from Kerin Perna, NP sent at 12/03/2018 12:30 PM EDT ----- Anemia present of chronic disease.  LDL is slight elevated recommend to decrease fatty foods, fried foods, cheese, eggs, butter other labs are within normal limits

## 2018-12-08 ENCOUNTER — Ambulatory Visit: Payer: Self-pay | Attending: Family Medicine

## 2018-12-08 ENCOUNTER — Other Ambulatory Visit: Payer: Self-pay

## 2018-12-08 ENCOUNTER — Telehealth: Payer: Self-pay | Admitting: Primary Care

## 2018-12-08 NOTE — Telephone Encounter (Signed)
I called the Pt since she submitted some missing documents, like the taxes in witch show that she has a [employer] acct ending on [id number], I informed her that we need the last 3 months bank statement for this acct. She got rude to me and upset, and she want to talk to someone else from financial, I informed her that am the only person that is do financial and if she want she can contatc [employer] and deal with them but they are going to ask for the same think, I ask her and if you don't provide them they will denied her application, she said that she is upset and she will call back tomorrow

## 2018-12-13 ENCOUNTER — Other Ambulatory Visit (HOSPITAL_COMMUNITY)
Admission: RE | Admit: 2018-12-13 | Discharge: 2018-12-13 | Disposition: A | Discharge status: Home / Self Care | Payer: Medicaid Other | Source: Ambulatory Visit | Attending: Primary Care | Admitting: Primary Care

## 2018-12-13 ENCOUNTER — Encounter (INDEPENDENT_AMBULATORY_CARE_PROVIDER_SITE_OTHER): Payer: Self-pay | Admitting: Primary Care

## 2018-12-13 ENCOUNTER — Ambulatory Visit (INDEPENDENT_AMBULATORY_CARE_PROVIDER_SITE_OTHER): Payer: Self-pay | Admitting: Primary Care

## 2018-12-13 ENCOUNTER — Other Ambulatory Visit: Payer: Self-pay

## 2018-12-13 VITALS — BP 129/84 | HR 77 | Temp 97.5°F | Ht 64.0 in | Wt 245.6 lb

## 2018-12-13 DIAGNOSIS — D509 Iron deficiency anemia, unspecified: Secondary | ICD-10-CM

## 2018-12-13 DIAGNOSIS — Z1231 Encounter for screening mammogram for malignant neoplasm of breast: Secondary | ICD-10-CM

## 2018-12-13 DIAGNOSIS — Z124 Encounter for screening for malignant neoplasm of cervix: Secondary | ICD-10-CM | POA: Diagnosis present

## 2018-12-13 DIAGNOSIS — Z113 Encounter for screening for infections with a predominantly sexual mode of transmission: Secondary | ICD-10-CM | POA: Diagnosis present

## 2018-12-13 DIAGNOSIS — Z114 Encounter for screening for human immunodeficiency virus [HIV]: Secondary | ICD-10-CM

## 2018-12-13 DIAGNOSIS — K649 Unspecified hemorrhoids: Secondary | ICD-10-CM

## 2018-12-13 DIAGNOSIS — R609 Edema, unspecified: Secondary | ICD-10-CM

## 2018-12-13 DIAGNOSIS — K5903 Drug induced constipation: Secondary | ICD-10-CM

## 2018-12-13 MED ORDER — PRENATAL VITAMIN AND MINERAL 28-0.8 MG PO TABS: 28.0000 mg | ORAL_TABLET | ORAL | 1 refills | Status: DC

## 2018-12-13 MED ORDER — HYDROCORTISONE ACETATE 25 MG RE SUPP: 25.0000 mg | Freq: Two times a day (BID) | RECTAL | 0 refills | Status: AC

## 2018-12-13 MED ORDER — NAPROXEN 500 MG PO TABS: 500.0000 mg | ORAL_TABLET | Freq: Two times a day (BID) | ORAL | 2 refills | Status: AC

## 2018-12-13 MED ORDER — SENNA 8.6 MG PO TABS: 1.0000 | ORAL_TABLET | Freq: Every day | ORAL | 3 refills | Status: AC

## 2018-12-13 MED ORDER — FUROSEMIDE 20 MG PO TABS: ORAL_TABLET | ORAL | 1 refills | Status: AC

## 2018-12-13 NOTE — Patient Instructions (Signed)
The USPSTF recommendations screening of cervical cancer every 3 years with cervical cytology.  All women age 40 to 65 years are at risk for cervical cancer because of potential exposure to high risk HPV types to sexual intercourse and should be screened.  Certainly risk factors further increased risk for cervical cancer including HIV infection, a compromised immune system, and utero exposure to diethylstilbestrol and previous treatment of high-grade precancerous lesions or cervical cancer.  Women with these risk factors should receive individual follow-up 

## 2018-12-13 NOTE — Progress Notes (Signed)
  Pt needs Naproxen, iron, fluid pills
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
## 2018-12-14 ENCOUNTER — Telehealth (INDEPENDENT_AMBULATORY_CARE_PROVIDER_SITE_OTHER): Payer: Self-pay

## 2018-12-14 LAB — HIV ANTIBODY (ROUTINE TESTING W REFLEX): HIV Screen 4th Generation wRfx: NONREACTIVE

## 2018-12-14 NOTE — Telephone Encounter (Signed)
-----   Message from Kerin Perna, NP sent at 12/14/2018  9:06 AM EDT ----- HIV negative

## 2018-12-14 NOTE — Telephone Encounter (Signed)
Patient verified date of birth and was informed that HIV is negative. Will call patient with pap results when they are available. Nat Christen, CMA

## 2018-12-17 ENCOUNTER — Other Ambulatory Visit: Payer: Self-pay

## 2018-12-17 ENCOUNTER — Ambulatory Visit (INDEPENDENT_AMBULATORY_CARE_PROVIDER_SITE_OTHER): Payer: Medicaid Other | Admitting: Family

## 2018-12-17 ENCOUNTER — Encounter: Payer: Self-pay | Admitting: Family

## 2018-12-17 ENCOUNTER — Encounter: Payer: Self-pay | Admitting: General Practice

## 2018-12-17 VITALS — BP 114/76 | HR 64 | Temp 98.3°F | Ht 64.0 in | Wt 245.2 lb

## 2018-12-17 DIAGNOSIS — Z3202 Encounter for pregnancy test, result negative: Secondary | ICD-10-CM | POA: Diagnosis not present

## 2018-12-17 DIAGNOSIS — Z30433 Encounter for removal and reinsertion of intrauterine contraceptive device: Secondary | ICD-10-CM

## 2018-12-17 LAB — CYTOLOGY - PAP
Adequacy: ABSENT
Chlamydia: NEGATIVE
Diagnosis: NEGATIVE
HSV1: NEGATIVE
HSV2: NEGATIVE
Neisseria Gonorrhea: NEGATIVE
Trichomonas: NEGATIVE

## 2018-12-17 LAB — POCT URINE PREGNANCY: Preg Test, Ur: NEGATIVE

## 2018-12-17 MED ORDER — LEVONORGESTREL 20 MCG/24HR IU IUD
INTRAUTERINE_SYSTEM | Freq: Once | INTRAUTERINE | Status: AC
  Administered 2018-12-17: 52 mg via INTRAUTERINE

## 2018-12-17 NOTE — Progress Notes (Signed)
History:  Ms. Paige Novak is a 38 y.o. [id number] who presents to clinic today for IUD removal and replacement.  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  IUD works well for her and would like to continue.  No other problems or concerns.     The following portions of the patient's history were reviewed and updated as appropriate: allergies, current medications, family history, past medical history, social history, past surgical history and problem list.  Review of Systems:  Review of Systems  See HPI   Objective:  Physical Exam BP 114/76 (BP Location: Right Wrist, Patient Position: Sitting, Cuff Size: Normal)   Pulse 64   Temp 98.3 F (36.8 C) (Oral)   Ht 5\' 4"  (1.626 m)   Wt 245 lb 3.2 oz (111.2 kg)   BMI 42.09 kg/m  Physical Exam Constitutional:      General: She is not in acute distress.    Appearance: She is well-developed.  HENT:     Head: Normocephalic and atraumatic.  Neck:     Musculoskeletal: Normal range of motion and neck supple.  Abdominal:     General: Bowel sounds are normal.  Skin:    General: Skin is warm and dry.  Neurological:     Mental Status: She is alert and oriented to person, place, and time.      Labs and Imaging Results for orders placed or performed in visit on 12/17/18 (from the past 24 hour(s))  POCT urine pregnancy     Status: Normal   Collection Time: 12/17/18 10:43 AM  Result Value Ref Range   Preg Test, Ur Negative Negative    No results found.  Patient identified, informed consent performed, signed copy in chart, time out was performed.  Urine pregnancy test negative.  Speculum placed in the vagina.  Cervix and IUD strings visualized.  Grasped strings with ring forceps and removed without difficulty.  Proceeded to clean cervix  with Betadine x 2.  Grasped anteriourly with a single tooth tenaculum.  Uterus sounded to 7.  Mirena IUD placed per manufacturer's recommendations.  Strings trimmed to 3 cm.    Patient given post procedure instructions and Mirena care card with expiration date.  Patient is asked to check IUD strings periodically and follow up in 4-6 weeks for IUD check.  Assessment & Plan:  1. Encounter for removal and reinsertion of intrauterine contraceptive device (IUD) - POCT urine pregnancy - levonorgestrel (MIRENA) 20 MCG/24HR IUD - Follow-up in 4-6 weeks  2.  Screening for Mammogram - Patient states referred for mammography by family practice, if any difficulty getting covered contact us for a referral to Rudene Anda, CNM 12/17/2018 10:54 AM

## 2018-12-20 ENCOUNTER — Telehealth (INDEPENDENT_AMBULATORY_CARE_PROVIDER_SITE_OTHER): Payer: Self-pay

## 2018-12-20 NOTE — Telephone Encounter (Signed)
After verifying date of birth. Patient is aware that pap is normal and STD panel negative. Nat Christen, CMA

## 2018-12-20 NOTE — Telephone Encounter (Signed)
-----   Message from Kerin Perna, NP sent at 12/19/2018  8:06 PM EDT ----- Pap normal STD panel negative

## 2018-12-23 ENCOUNTER — Other Ambulatory Visit (INDEPENDENT_AMBULATORY_CARE_PROVIDER_SITE_OTHER): Payer: Self-pay | Admitting: Primary Care

## 2018-12-23 LAB — CERVICOVAGINAL ANCILLARY ONLY
Bacterial Vaginitis (gardnerella): POSITIVE — AB
Candida Glabrata: NEGATIVE
Candida Vaginitis: NEGATIVE
Chlamydia: NEGATIVE
Comment: NEGATIVE
Comment: NEGATIVE
Comment: NEGATIVE
Comment: NEGATIVE
Comment: NORMAL
Neisseria Gonorrhea: NEGATIVE
Trichomonas: NEGATIVE

## 2018-12-23 MED ORDER — METRONIDAZOLE 500 MG PO TABS: 500.0000 mg | ORAL_TABLET | Freq: Two times a day (BID) | ORAL | 0 refills | Status: AC

## 2018-12-23 NOTE — Progress Notes (Unsigned)
flagy

## 2019-01-13 ENCOUNTER — Other Ambulatory Visit (INDEPENDENT_AMBULATORY_CARE_PROVIDER_SITE_OTHER): Payer: Self-pay | Admitting: Primary Care

## 2019-01-13 ENCOUNTER — Ambulatory Visit: Payer: Medicaid Other | Admitting: Obstetrics and Gynecology

## 2019-01-13 DIAGNOSIS — Z1231 Encounter for screening mammogram for malignant neoplasm of breast: Secondary | ICD-10-CM

## 2019-01-27 ENCOUNTER — Ambulatory Visit (INDEPENDENT_AMBULATORY_CARE_PROVIDER_SITE_OTHER): Payer: Medicaid Other | Admitting: Obstetrics and Gynecology

## 2019-01-27 ENCOUNTER — Other Ambulatory Visit: Payer: Self-pay

## 2019-01-27 ENCOUNTER — Encounter: Payer: Self-pay | Admitting: Obstetrics and Gynecology

## 2019-01-27 VITALS — BP 107/71 | HR 82 | Temp 97.3°F | Wt 245.4 lb

## 2019-01-27 DIAGNOSIS — Z30431 Encounter for routine checking of intrauterine contraceptive device: Secondary | ICD-10-CM

## 2019-01-27 DIAGNOSIS — Z3040 Encounter for surveillance of contraceptives, unspecified: Secondary | ICD-10-CM
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
## 2019-08-22 ENCOUNTER — Other Ambulatory Visit: Payer: Self-pay

## 2019-08-22 DIAGNOSIS — N63 Unspecified lump in unspecified breast: Secondary | ICD-10-CM

## 2019-08-22 DIAGNOSIS — N644 Mastodynia: Secondary | ICD-10-CM

## 2019-08-30 ENCOUNTER — Ambulatory Visit: Payer: Self-pay | Admitting: *Deleted

## 2019-08-30 ENCOUNTER — Other Ambulatory Visit: Payer: Self-pay

## 2019-08-30 VITALS — BP 117/82 | Temp 97.3°F | Wt 243.5 lb

## 2019-08-30 DIAGNOSIS — Z1239 Encounter for other screening for malignant neoplasm of breast: Secondary | ICD-10-CM

## 2019-08-30 DIAGNOSIS — N6314 Unspecified lump in the right breast, lower inner quadrant: Secondary | ICD-10-CM

## 2019-08-30 NOTE — Patient Instructions (Addendum)
Informed Paige Novak about breast self awareness and gave educational materials to take home. Patient did not need a Pap smear today due to last Pap smear was on 12/13/2018 per patient. Let her know [facility]CP will cover Pap smears every 3 years unless has a history of abnormal Pap smears. Referred patient to the [facility] for diagnostic mammogram. Appointment scheduled for September 01, 2019 at 9:40am. Patient aware of appointment and will be there. Paige Novak verbalized understanding.  [name], RN, FNP student [phone] PM

## 2019-08-30 NOTE — Progress Notes (Signed)
Ms. Paige Novak is a 39 y.o. female who presents to [facility] clinic today with complaints of a right breast lump with pain.    Pap Smear: Pap not smear completed today. Last Pap smear was 12/13/2018 at [facility] and was normal. HPV testing was not included with this Pap. Per patient, she has a history of an abnormal Pap smear 10 years ago with follow up colposcopy. She has had 3 normal Pap smears since then. Last Pap smear result is available in Epic.   Physical exam: Breasts Breasts symmetrical. No skin abnormalities bilateral breasts. No nipple retraction bilateral breasts. No nipple discharge bilateral breasts. No lymphadenopathy. No lumps palpated left breast. Lump palpated in the right breast in the 5 o'clock position, 1 cm from the nipple. 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 She describes her pain as a 7/10 and states that it comes and goes throughout the day. Her right breast is also tender to the touch.   Pelvic/Bimanual Pap is not indicated today.    Smoking History: Patient is a non smoker.   Patient Navigation: Patient education provided. Access to services provided for patient through BCCCP program.   Breast and Cervical Cancer Risk Assessment: Patient does not have a family history of breast cancer, known genetic mutations, or radiation treatment to the chest before age 13. Patient does not have history of cervical dysplasia, immunocompromised, or DES exposure in-utero.  Risk Assessment    Risk Scores      08/30/2019   Last edited by: Meryl Dare, CMA   5-year risk: 0.6 %   Lifetime risk: 9.6 %          A: BCCCP exam without pap smear Complaint of right breast lump.  P: Referred patient to the [facility] for a diagnostic mammogram. Appointment scheduled September 01, 2019 at 9:40am.  [name], RN, FNP student 08/30/2019 4:01 PM   Attestation of Supervision of Student:  I  confirm that I have verified the information documented in the nurse practitioner student's note and that I have also personally reperformed the history, physical exam and all medical decision making activities.  I have verified that all services and findings are accurately documented in this student's note; and I agree with management and plan as outlined in the documentation. I have also made any necessary editorial changes.  Brannock, Kathaleen Maser, RN [facility] [employer], [facility] 08/30/2019 4:35 PM

## 2019-09-01 ENCOUNTER — Ambulatory Visit
Admission: RE | Admit: 2019-09-01 | Discharge: 2019-09-01 | Disposition: A | Discharge status: Home / Self Care | Payer: Medicaid Other | Source: Ambulatory Visit | Attending: Obstetrics and Gynecology | Admitting: Obstetrics and Gynecology

## 2019-09-01 ENCOUNTER — Ambulatory Visit
Admission: RE | Admit: 2019-09-01 | Discharge: 2019-09-01 | Disposition: A | Discharge status: Home / Self Care | Payer: No Typology Code available for payment source | Source: Ambulatory Visit | Attending: Obstetrics and Gynecology | Admitting: Obstetrics and Gynecology

## 2019-09-01 ENCOUNTER — Other Ambulatory Visit: Payer: Self-pay | Admitting: Obstetrics and Gynecology

## 2019-09-01 ENCOUNTER — Other Ambulatory Visit: Payer: Self-pay

## 2019-09-01 DIAGNOSIS — N63 Unspecified lump in unspecified breast: Secondary | ICD-10-CM

## 2019-09-01 DIAGNOSIS — N644 Mastodynia: Secondary | ICD-10-CM

## 2019-09-01 DIAGNOSIS — N6310 Unspecified lump in the right breast, unspecified quadrant: Secondary | ICD-10-CM

## 2019-12-02 ENCOUNTER — Other Ambulatory Visit: Payer: No Typology Code available for payment source

## 2019-12-07 ENCOUNTER — Other Ambulatory Visit: Payer: No Typology Code available for payment source

## 2020-07-17 ENCOUNTER — Other Ambulatory Visit: Payer: Self-pay

## 2020-07-17 ENCOUNTER — Other Ambulatory Visit (HOSPITAL_BASED_OUTPATIENT_CLINIC_OR_DEPARTMENT_OTHER): Payer: Self-pay

## 2020-07-17 ENCOUNTER — Ambulatory Visit: Payer: Medicaid Other | Attending: Internal Medicine

## 2020-07-17 DIAGNOSIS — Z23 Encounter for immunization: Secondary | ICD-10-CM

## 2020-07-17 MED ORDER — PFIZER-BIONT COVID-19 VAC-TRIS 30 MCG/0.3ML IM SUSP
INTRAMUSCULAR | 0 refills | Status: AC
  Filled 2020-07-17: qty 0.3, 1d supply, fill #0

## 2020-07-17 NOTE — Progress Notes (Signed)
   Covid-19 Vaccination Clinic  Name:  Paige Novak    MRN: [id number] DOB: 1981-03-05  07/17/2020  [name] was observed post Covid-19 immunization for 15 minutes without incident. She was provided with Vaccine Information Sheet and instruction to access the V-Safe system.   [name] was instructed to call 911 with any severe reactions post vaccine: Marland Kitchen Difficulty breathing  . Swelling of face and throat  . A fast heartbeat  . A bad rash all over body  . Dizziness and weakness   Immunizations Administered    Name Date Dose VIS Date Route   PFIZER Comrnaty(Gray TOP) Covid-19 Vaccine 07/17/2020 10:53 AM 0.3 mL 02/16/2020 Intramuscular   Manufacturer: [employer], [employer]   Lot: [id number]   NDC: [phone]
